# Patient Record
Sex: Female | Born: 1995 | Hispanic: No | Marital: Single | Smoking: Never smoker
Health system: Southern US, Community
[De-identification: ages and names within clinical notes are randomized; demographics above are authoritative.]

## PROBLEM LIST (undated history)

## (undated) DIAGNOSIS — D571 Sickle-cell disease without crisis: Secondary | ICD-10-CM

## (undated) DIAGNOSIS — D649 Anemia, unspecified: Secondary | ICD-10-CM

## (undated) HISTORY — DX: Anemia, unspecified: D64.9

## (undated) HISTORY — DX: Sickle-cell disease without crisis: D57.1

---

## 2014-03-06 ENCOUNTER — Ambulatory Visit (INDEPENDENT_AMBULATORY_CARE_PROVIDER_SITE_OTHER): Payer: Managed Care, Other (non HMO) | Admitting: Physician Assistant

## 2014-03-06 VITALS — BP 118/82 | HR 121 | Temp 98.7°F | Resp 16 | Ht 61.0 in | Wt 115.0 lb

## 2014-03-06 DIAGNOSIS — N946 Dysmenorrhea, unspecified: Secondary | ICD-10-CM

## 2014-03-06 MED ORDER — IBUPROFEN 800 MG PO TABS: 800.0000 mg | ORAL_TABLET | Freq: Three times a day (TID) | ORAL | Status: DC | PRN

## 2014-03-06 NOTE — Patient Instructions (Signed)
Take the ibuprofen 800mg  every 8 hours with food - this will help your cramps and back pain  Drink plenty of water  If you periods continue to be very painful, we can start you on birth control that will help improve this - read through the information I gave you.  If you decide to start this please let me know and I will send medicine to your pharmacy

## 2014-03-06 NOTE — Progress Notes (Signed)
   Subjective:    Patient ID: Adrienne Hawkins, female    DOB: 1996/07/24, 18 y.o.   MRN: [id number]  HPI   Adrienne Hawkins is a pleasant 18 yr old female here with concern for dysmenorrhea.  She states she is having bad menstrual cramps.  This started 2 hours ago.  She is very upset and crying in the exam room.  She always has very painful periods.  She has taken nothing for symptoms.  She wants an injection of pain medicine.  She reports that she is bleeding the usual amount.  States her whole abdomen is sore as is her lower back.  She denies NVD.  No fever.  She has never been sexually active - no chance of pregnancy or STI.  Last period 4 wks ago - unsure what day.  Unsure if pt is having regular periods.  She is not using contraception.  There is a language barrier.   Review of Systems  Constitutional: Negative for fever and chills.  Respiratory: Negative.   Cardiovascular: Negative.   Gastrointestinal: Positive for abdominal pain. Negative for nausea, vomiting and diarrhea.  Genitourinary: Positive for vaginal bleeding and pelvic pain. Negative for dysuria, urgency and frequency.  Musculoskeletal: Positive for back pain.  Skin: Negative.        Objective:   Physical Exam  Vitals reviewed. Constitutional: She is oriented to person, place, and time. She appears well-developed and well-nourished. No distress.  HENT:  Head: Normocephalic and atraumatic.  Eyes: Conjunctivae are normal. No scleral icterus.  Cardiovascular: Normal rate, regular rhythm and normal heart sounds.   Pulmonary/Chest: Effort normal and breath sounds normal. She has no wheezes. She has no rales.  Abdominal: Soft. There is tenderness (diffuse but ost prominent in suprapubic area).  Neurological: She is alert and oriented to person, place, and time.  Skin: Skin is warm and dry.  Psychiatric: She has a normal mood and affect. Her behavior is normal.       Assessment & Plan:  Dysmenorrhea - Plan: ibuprofen  (ADVIL,MOTRIN) 800 MG tablet   Adrienne Hawkins is a pleasant 18 yr old female with dysmenorrhea.  Exam reveals diffuse abdominal tenderness but there is no rebound/rigidity.  Afebrile, VSS.  She has never been sexually active, so no chance of pregnancy or STI.  She reports she is bleeding the normal amount.  LMP 4 wks ago.  Will start ibuprofen 800mg  TID with food prn cramping.  Discussed with pt that starting a contraceptive method will likely improve her symptoms.  I have provided her with information to read over about contraceptive options.  She will call if she would like to start medication.  Pt to call or RTC if worsening or not improving  E. Frances FurbishElizabeth Takyah Hawkins MHS, PA-C [facility] 3/22/20159:42 AM

## 2014-04-14 ENCOUNTER — Emergency Department (HOSPITAL_COMMUNITY)
Admission: EM | Admit: 2014-04-14 | Discharge: 2014-04-14 | Disposition: A | Discharge status: Home / Self Care | Payer: Managed Care, Other (non HMO) | Attending: Emergency Medicine | Admitting: Emergency Medicine

## 2014-04-14 ENCOUNTER — Encounter (HOSPITAL_COMMUNITY): Payer: Self-pay | Admitting: Emergency Medicine

## 2014-04-14 DIAGNOSIS — F411 Generalized anxiety disorder: Secondary | ICD-10-CM | POA: Insufficient documentation

## 2014-04-14 DIAGNOSIS — K2990 Gastroduodenitis, unspecified, without bleeding: Secondary | ICD-10-CM

## 2014-04-14 DIAGNOSIS — D649 Anemia, unspecified: Secondary | ICD-10-CM | POA: Insufficient documentation

## 2014-04-14 DIAGNOSIS — R0682 Tachypnea, not elsewhere classified: Secondary | ICD-10-CM | POA: Insufficient documentation

## 2014-04-14 DIAGNOSIS — K2960 Other gastritis without bleeding: Secondary | ICD-10-CM

## 2014-04-14 DIAGNOSIS — R51 Headache: Secondary | ICD-10-CM | POA: Insufficient documentation

## 2014-04-14 DIAGNOSIS — R519 Headache, unspecified: Secondary | ICD-10-CM

## 2014-04-14 DIAGNOSIS — H53149 Visual discomfort, unspecified: Secondary | ICD-10-CM | POA: Insufficient documentation

## 2014-04-14 DIAGNOSIS — K2970 Gastritis, unspecified, without bleeding: Secondary | ICD-10-CM | POA: Insufficient documentation

## 2014-04-14 DIAGNOSIS — T394X5A Adverse effect of antirheumatics, not elsewhere classified, initial encounter: Secondary | ICD-10-CM | POA: Insufficient documentation

## 2014-04-14 DIAGNOSIS — Z79899 Other long term (current) drug therapy: Secondary | ICD-10-CM | POA: Insufficient documentation

## 2014-04-14 DIAGNOSIS — T39395A Adverse effect of other nonsteroidal anti-inflammatory drugs [NSAID], initial encounter: Secondary | ICD-10-CM

## 2014-04-14 DIAGNOSIS — J3489 Other specified disorders of nose and nasal sinuses: Secondary | ICD-10-CM | POA: Insufficient documentation

## 2014-04-14 LAB — CBC WITH DIFFERENTIAL/PLATELET
Basophils Absolute: 0 10*3/uL (ref 0.0–0.1)
Basophils Relative: 0 % (ref 0–1)
EOS ABS: 0.1 10*3/uL (ref 0.0–0.7)
Eosinophils Relative: 1 % (ref 0–5)
HCT: 31.3 % — ABNORMAL LOW (ref 36.0–46.0)
HEMOGLOBIN: 10 g/dL — AB (ref 12.0–15.0)
Lymphocytes Relative: 49 % — ABNORMAL HIGH (ref 12–46)
Lymphs Abs: 2.7 10*3/uL (ref 0.7–4.0)
MCH: 19.2 pg — AB (ref 26.0–34.0)
MCHC: 31.9 g/dL (ref 30.0–36.0)
MCV: 60 fL — AB (ref 78.0–100.0)
Monocytes Absolute: 0.5 10*3/uL (ref 0.1–1.0)
Monocytes Relative: 9 % (ref 3–12)
Neutro Abs: 2.3 10*3/uL (ref 1.7–7.7)
Neutrophils Relative %: 41 % — ABNORMAL LOW (ref 43–77)
Platelets: 337 10*3/uL (ref 150–400)
RBC: 5.22 MIL/uL — ABNORMAL HIGH (ref 3.87–5.11)
RDW: 15.3 % (ref 11.5–15.5)
WBC: 5.6 10*3/uL (ref 4.0–10.5)

## 2014-04-14 LAB — COMPREHENSIVE METABOLIC PANEL
ALK PHOS: 42 U/L (ref 39–117)
ALT: 6 U/L (ref 0–35)
AST: 12 U/L (ref 0–37)
Albumin: 4.5 g/dL (ref 3.5–5.2)
BUN: 9 mg/dL (ref 6–23)
CO2: 21 mEq/L (ref 19–32)
Calcium: 9.6 mg/dL (ref 8.4–10.5)
Chloride: 102 mEq/L (ref 96–112)
Creatinine, Ser: 0.63 mg/dL (ref 0.50–1.10)
Glucose, Bld: 99 mg/dL (ref 70–99)
Potassium: 3.7 mEq/L (ref 3.7–5.3)
Sodium: 138 mEq/L (ref 137–147)
TOTAL PROTEIN: 7.5 g/dL (ref 6.0–8.3)
Total Bilirubin: 0.7 mg/dL (ref 0.3–1.2)

## 2014-04-14 LAB — LIPASE, BLOOD: LIPASE: 28 U/L (ref 11–59)

## 2014-04-14 LAB — HCG, SERUM, QUALITATIVE: Preg, Serum: NEGATIVE

## 2014-04-14 MED ORDER — ESOMEPRAZOLE MAGNESIUM 20 MG PO CPDR: 20.0000 mg | DELAYED_RELEASE_CAPSULE | Freq: Every day | ORAL | Status: DC

## 2014-04-14 MED ORDER — ONDANSETRON 4 MG PO TBDP
4.0000 mg | ORAL_TABLET | Freq: Once | ORAL | Status: AC
  Administered 2014-04-14: 4 mg via ORAL
  Filled 2014-04-14: qty 1

## 2014-04-14 MED ORDER — TRAMADOL HCL 50 MG PO TABS: 50.0000 mg | ORAL_TABLET | Freq: Four times a day (QID) | ORAL | Status: DC | PRN

## 2014-04-14 MED ORDER — GI COCKTAIL ~~LOC~~
30.0000 mL | Freq: Once | ORAL | Status: AC
  Administered 2014-04-14: 30 mL via ORAL
  Filled 2014-04-14: qty 30

## 2014-04-14 MED ORDER — PANTOPRAZOLE SODIUM 40 MG PO TBEC
40.0000 mg | DELAYED_RELEASE_TABLET | Freq: Once | ORAL | Status: AC
  Administered 2014-04-14: 40 mg via ORAL
  Filled 2014-04-14: qty 1

## 2014-04-14 MED ORDER — ONDANSETRON 4 MG PO TBDP: ORAL_TABLET | ORAL | Status: DC

## 2014-04-14 NOTE — ED Notes (Signed)
Pt reports having headache that started at 8pm. Family reports pt saying she took ibuprofen but pt had no relief. Pt denies nausea or vomiting but reports sensitivity to light. Pt alert in triage area.

## 2014-04-14 NOTE — ED Provider Notes (Signed)
CSN: [id number]     Arrival date & time 04/14/14  0056 History   First MD Initiated Contact with Patient 04/14/14 0131     Chief Complaint  Patient presents with  . Headache     (Consider location/radiation/quality/duration/timing/severity/associated sxs/prior Treatment) HPI Patient presents for abdominal pain and nausea after taking multiple doses of ibuprofen for a frontal headache that started at 8 PM this evening. The patient's headache is dramatically improved. The headache is worse with bending forward. She has mild sensitivity to light. She denies any neck pain or stiffness. Has no focal weakness or numbness. She's been under increased stress lately and believes this is contributing to her headache. Patient admits to taking 7 tablets of ibuprofen between the hours of 8 PM and midnight. This is an attempt to relieve pain and not suicide attempt. Past Medical History  Diagnosis Date  . Sickle cell anemia     doesnt really know   History reviewed. No pertinent past surgical history. No family history on file. History  Substance Use Topics  . Smoking status: Never Smoker   . Smokeless tobacco: Not on file  . Alcohol Use: No   OB History   Grav Para Term Preterm Abortions TAB SAB Ect Mult Living                 Review of Systems  Constitutional: Negative for fever and chills.  HENT: Positive for congestion and sinus pressure.   Eyes: Positive for photophobia.  Respiratory: Negative for cough.   Cardiovascular: Negative for chest pain.  Gastrointestinal: Positive for nausea and abdominal pain. Negative for vomiting.  Musculoskeletal: Negative for back pain, myalgias, neck pain and neck stiffness.  Skin: Negative for rash and wound.  Neurological: Positive for headaches. Negative for dizziness, weakness, light-headedness and numbness.  All other systems reviewed and are negative.     Allergies  Review of patient's allergies indicates no known allergies.  Home  Medications   Prior to Admission medications   Medication Sig Start Date End Date Taking? Authorizing Provider  folic acid (FOLVITE) 1 MG tablet Take 1 mg by mouth daily.   Yes Historical Provider, MD  ibuprofen (ADVIL,MOTRIN) 800 MG tablet Take 1 tablet (800 mg total) by mouth every 8 (eight) hours as needed. 03/06/14  Yes Eleanore E Egan, PA-C   BP 128/75  Pulse 80  Temp(Src) 98 F (36.7 C) (Oral)  Resp 18  SpO2 100% Physical Exam  Nursing note and vitals reviewed. Constitutional: She is oriented to person, place, and time. She appears well-developed and well-nourished. No distress.  Anxious appearing  HENT:  Head: Normocephalic and atraumatic.  Mouth/Throat: Oropharynx is clear and moist.  Mild tenderness to percussion over the frontal sinuses. She has bilateral nasal mucosal edema.  Eyes: EOM are normal. Pupils are equal, round, and reactive to light.  Neck: Normal range of motion. Neck supple.  No meningismus  Cardiovascular: Normal rate and regular rhythm.   Pulmonary/Chest: Breath sounds normal. No respiratory distress. She has no wheezes. She has no rales.  Mild tachypnea  Abdominal: Soft. Bowel sounds are normal. She exhibits no distension and no mass. There is tenderness (to palpation in the epigastric and left upper quadrant.). There is no rebound and no guarding.  Musculoskeletal: Normal range of motion. She exhibits no edema and no tenderness.  Neurological: She is alert and oriented to person, place, and time.  Patient is alert and oriented x3 with clear, goal oriented speech. Patient has 5/5 motor in  all extremities. Sensation is intact to light touch.   Skin: Skin is warm and dry. No rash noted. No erythema.  Psychiatric:  Anxious    ED Course  Procedures (including critical care time) Labs Review Labs Reviewed  CBC WITH DIFFERENTIAL  COMPREHENSIVE METABOLIC PANEL  LIPASE, BLOOD    Imaging Review No results found.   EKG Interpretation None      MDM    Final diagnoses:  None    Suspect NSAID-induced gastritis. Headache may be tension/migraine/sinus related. It is improved now. She has had similar headaches in the past. It was gradual onset. She has a normal neurologic exam. There is no concerning features for this headache requiring imaging.   Patient is feeling much better now. She's had no further nausea in the emergency department. Her abdomen is soft. She's been advised to take medication only as it is prescribed. Return precautions have been given and patient voiced understanding.  Loren Raceravid Lashaundra Lehrmann, MD 04/14/14 [id number]

## 2014-04-14 NOTE — Discharge Instructions (Signed)
Stop taking ibuprofen. You may take Tylenol for headaches. Take prescribed medication and followup with gastroenterology should her symptoms continue. Return immediately for blood in your stool, vomiting blood, worsening pain, fever or for any concerns.  Gastritis, Adult Gastritis is soreness and swelling (inflammation) of the lining of the stomach. Gastritis can develop as a sudden onset (acute) or long-term (chronic) condition. If gastritis is not treated, it can lead to stomach bleeding and ulcers. CAUSES  Gastritis occurs when the stomach lining is weak or damaged. Digestive juices from the stomach then inflame the weakened stomach lining. The stomach lining may be weak or damaged due to viral or bacterial infections. One common bacterial infection is the Helicobacter pylori infection. Gastritis can also result from excessive alcohol consumption, taking certain medicines, or having too much acid in the stomach.  SYMPTOMS  In some cases, there are no symptoms. When symptoms are present, they may include:  Pain or a burning sensation in the upper abdomen.  Nausea.  Vomiting.  An uncomfortable feeling of fullness after eating. DIAGNOSIS  Your caregiver may suspect you have gastritis based on your symptoms and a physical exam. To determine the cause of your gastritis, your caregiver may perform the following:  Blood or stool tests to check for the H pylori bacterium.  Gastroscopy. A thin, flexible tube (endoscope) is passed down the esophagus and into the stomach. The endoscope has a light and camera on the end. Your caregiver uses the endoscope to view the inside of the stomach.  Taking a tissue sample (biopsy) from the stomach to examine under a microscope. TREATMENT  Depending on the cause of your gastritis, medicines may be prescribed. If you have a bacterial infection, such as an H pylori infection, antibiotics may be given. If your gastritis is caused by too much acid in the stomach,  H2 blockers or antacids may be given. Your caregiver may recommend that you stop taking aspirin, ibuprofen, or other nonsteroidal anti-inflammatory drugs (NSAIDs). HOME CARE INSTRUCTIONS  Only take over-the-counter or prescription medicines as directed by your caregiver.  If you were given antibiotic medicines, take them as directed. Finish them even if you start to feel better.  Drink enough fluids to keep your urine clear or pale yellow.  Avoid foods and drinks that make your symptoms worse, such as:  Caffeine or alcoholic drinks.  Chocolate.  Peppermint or mint flavorings.  Garlic and onions.  Spicy foods.  Citrus fruits, such as oranges, lemons, or limes.  Tomato-based foods such as sauce, chili, salsa, and pizza.  Fried and fatty foods.  Eat small, frequent meals instead of large meals. SEEK IMMEDIATE MEDICAL CARE IF:   You have black or dark red stools.  You vomit blood or material that looks like coffee grounds.  You are unable to keep fluids down.  Your abdominal pain gets worse.  You have a fever.  You do not feel better after 1 week.  You have any other questions or concerns. MAKE SURE YOU:  Understand these instructions.  Will watch your condition.  Will get help right away if you are not doing well or get worse. Document Released: 11/27/2001 Document Revised: 06/03/2012 Document Reviewed: 01/16/2012 [facility] Patient Information 2014 [employer] [place], [place].

## 2014-05-10 ENCOUNTER — Emergency Department (HOSPITAL_COMMUNITY)
Admission: EM | Admit: 2014-05-10 | Discharge: 2014-05-10 | Disposition: A | Discharge status: Home / Self Care | Payer: Managed Care, Other (non HMO) | Attending: Emergency Medicine | Admitting: Emergency Medicine

## 2014-05-10 ENCOUNTER — Encounter (HOSPITAL_COMMUNITY): Payer: Self-pay | Admitting: Emergency Medicine

## 2014-05-10 DIAGNOSIS — S1093XA Contusion of unspecified part of neck, initial encounter: Secondary | ICD-10-CM

## 2014-05-10 DIAGNOSIS — S060X9A Concussion with loss of consciousness of unspecified duration, initial encounter: Secondary | ICD-10-CM

## 2014-05-10 DIAGNOSIS — F430 Acute stress reaction: Secondary | ICD-10-CM | POA: Insufficient documentation

## 2014-05-10 DIAGNOSIS — R51 Headache: Secondary | ICD-10-CM

## 2014-05-10 DIAGNOSIS — R42 Dizziness and giddiness: Secondary | ICD-10-CM | POA: Insufficient documentation

## 2014-05-10 DIAGNOSIS — Y9389 Activity, other specified: Secondary | ICD-10-CM | POA: Insufficient documentation

## 2014-05-10 DIAGNOSIS — Z79899 Other long term (current) drug therapy: Secondary | ICD-10-CM | POA: Insufficient documentation

## 2014-05-10 DIAGNOSIS — S0003XA Contusion of scalp, initial encounter: Secondary | ICD-10-CM | POA: Insufficient documentation

## 2014-05-10 DIAGNOSIS — Y92009 Unspecified place in unspecified non-institutional (private) residence as the place of occurrence of the external cause: Secondary | ICD-10-CM | POA: Insufficient documentation

## 2014-05-10 DIAGNOSIS — W1809XA Striking against other object with subsequent fall, initial encounter: Secondary | ICD-10-CM | POA: Insufficient documentation

## 2014-05-10 DIAGNOSIS — S060X1A Concussion with loss of consciousness of 30 minutes or less, initial encounter: Secondary | ICD-10-CM

## 2014-05-10 DIAGNOSIS — D571 Sickle-cell disease without crisis: Secondary | ICD-10-CM | POA: Insufficient documentation

## 2014-05-10 DIAGNOSIS — R519 Headache, unspecified: Secondary | ICD-10-CM

## 2014-05-10 DIAGNOSIS — W010XXA Fall on same level from slipping, tripping and stumbling without subsequent striking against object, initial encounter: Secondary | ICD-10-CM | POA: Insufficient documentation

## 2014-05-10 DIAGNOSIS — S0083XA Contusion of other part of head, initial encounter: Secondary | ICD-10-CM | POA: Insufficient documentation

## 2014-05-10 DIAGNOSIS — Z3202 Encounter for pregnancy test, result negative: Secondary | ICD-10-CM | POA: Insufficient documentation

## 2014-05-10 LAB — I-STAT CHEM 8, ED
BUN: 16 mg/dL (ref 6–23)
CHLORIDE: 104 meq/L (ref 96–112)
CREATININE: 0.7 mg/dL (ref 0.50–1.10)
Calcium, Ion: 1.25 mmol/L — ABNORMAL HIGH (ref 1.12–1.23)
Glucose, Bld: 119 mg/dL — ABNORMAL HIGH (ref 70–99)
HCT: 35 % — ABNORMAL LOW (ref 36.0–46.0)
HEMOGLOBIN: 11.9 g/dL — AB (ref 12.0–15.0)
POTASSIUM: 3.7 meq/L (ref 3.7–5.3)
SODIUM: 141 meq/L (ref 137–147)
TCO2: 24 mmol/L (ref 0–100)

## 2014-05-10 LAB — POC URINE PREG, ED: Preg Test, Ur: NEGATIVE

## 2014-05-10 MED ORDER — IBUPROFEN 800 MG PO TABS
800.0000 mg | ORAL_TABLET | Freq: Once | ORAL | Status: AC
  Administered 2014-05-10: 800 mg via ORAL
  Filled 2014-05-10: qty 1

## 2014-05-10 MED ORDER — SODIUM CHLORIDE 0.9 % IV BOLUS (SEPSIS)
1000.0000 mL | Freq: Once | INTRAVENOUS | Status: AC
Start: 2014-05-10 — End: 2014-05-10
  Administered 2014-05-10: 1000 mL via INTRAVENOUS

## 2014-05-10 MED ORDER — IBUPROFEN 800 MG PO TABS: 800.0000 mg | ORAL_TABLET | Freq: Three times a day (TID) | ORAL | Status: DC

## 2014-05-10 NOTE — ED Provider Notes (Signed)
CSN: [id number]     Arrival date & time 05/10/14  0142 History   First MD Initiated Contact with Patient 05/10/14 0210     Chief Complaint  Patient presents with  . Headache  . Loss of Consciousness   HPI  History provided by patient. Patient is a 18 year old [place] female who presents complaints of headache as well as a head injury. Patient reports having a waxing headache for the past 2 days. Headache has seemed worse with increased stress. It does seem slightly better when she rests and close her eyes. She has not used any medications to treat her headache symptoms. There has not been any recent illnesses, fever, chills or sweats. Patient is at home and in the bathroom when she had worsened headache and started to feel slightly lightheaded and dizzy. She reports stumbling and hitting her head against this. He believes there may have been a brief LOC but is unsure. She had a small mark to the scalp with only a small amount of bleeding. This is controlled. She does report being current on her immunizations for school. She also reports poor eating for the past 3 days. No other aggravating or alleviating factors. No other associated symptoms.    Past Medical History  Diagnosis Date  . Sickle cell anemia     doesnt really know   History reviewed. No pertinent past surgical history. History reviewed. No pertinent family history. History  Substance Use Topics  . Smoking status: Never Smoker   . Smokeless tobacco: Not on file  . Alcohol Use: No   OB History   Grav Para Term Preterm Abortions TAB SAB Ect Mult Living                 Review of Systems  Constitutional: Negative for fever and chills.  Eyes: Negative for visual disturbance.  Gastrointestinal: Negative for vomiting and diarrhea.  Neurological: Positive for light-headedness and headaches. Negative for dizziness.  All other systems reviewed and are negative.     Allergies  Review of patient's allergies indicates no  known allergies.  Home Medications   Prior to Admission medications   Medication Sig Start Date End Date Taking? Authorizing Provider  acetaminophen (TYLENOL) 500 MG tablet Take 1,000 mg by mouth every 6 (six) hours as needed (pain).   Yes Historical Provider, MD  folic acid (FOLVITE) 1 MG tablet Take 1 mg by mouth daily.   Yes Historical Provider, MD  ibuprofen (ADVIL,MOTRIN) 800 MG tablet Take 1 tablet (800 mg total) by mouth every 8 (eight) hours as needed. 03/06/14  Yes Eleanore E Egan, PA-C   BP 111/80  Pulse 90  Temp(Src) 97.5 F (36.4 C) (Oral)  SpO2 96%  LMP 03/10/2014 Physical Exam  Nursing note and vitals reviewed. Constitutional: She is oriented to person, place, and time. She appears well-developed and well-nourished. No distress.  HENT:  Head: Normocephalic.  There is a small mark with slight hematoma to the right for head and frontal scalp area. No bleeding or dried blood present. No depressed skull fracture. No Battle sign or raccoon eyes.  Eyes: Conjunctivae and EOM are normal. Pupils are equal, round, and reactive to light.  Neck: Normal range of motion. Neck supple.  No meningeal signs  Cardiovascular: Normal rate and regular rhythm.   No murmur heard. Pulmonary/Chest: Effort normal and breath sounds normal. No respiratory distress. She has no wheezes. She has no rales.  Abdominal: Soft. There is no tenderness. There is no rigidity, no  rebound, no guarding, no CVA tenderness and no tenderness at McBurney's point.  Neurological: She is alert and oriented to person, place, and time. She has normal strength. No cranial nerve deficit or sensory deficit. Gait normal.  Skin: Skin is warm and dry. No rash noted.  Psychiatric: She has a normal mood and affect. Her behavior is normal.    ED Course  Procedures   COORDINATION OF CARE:  Nursing notes reviewed. Vital signs reviewed. Initial pt interview and examination performed.   Filed Vitals:   05/10/14 0152 05/10/14  0211 05/10/14 0212 05/10/14 0213  BP: 123/87 109/72 128/74 111/80  Pulse: 106 87 113 90  Temp: 97.5 F (36.4 C)     TempSrc: Oral     SpO2: 96%       2:51 AM-patient seen and evaluated. She is resting appears well no acute distress. Does not appear severely ill toxic.  Patient with a small superficial mark to the right frontal scalp area. No bleeding. No depressed skull fractures. She has normal nonfocal neuro exam. She does report questionable LOC. I discussed options for a head CT given the possible LOC. Patient refused this. I discussed the risks of a possible brain injury, bleed or other concerning causes may have explained her headaches. She does not wish to have this performed. She states she feels headaches are related to her poor eating and stress about recently immigrating for school to the [place] away from family.  Patient feeling better after IV fluids and ibuprofen. Denies any significant headache at this time. Labs without any concerning findings. Patient continues to have a normal nonfocal neuro exam. Answering her spine questions normally. Continues not wish for any further imaging or testing. She is ready to be discharged home. Strict return precautions given.   Treatment plan initiated: Medications  ibuprofen (ADVIL,MOTRIN) tablet 800 mg (800 mg Oral Given 05/10/14 0338)  sodium chloride 0.9 % bolus 1,000 mL (1,000 mLs Intravenous New Bag/Given 05/10/14 0350)    Results for orders placed during the hospital encounter of 05/10/14  POC URINE PREG, ED      Result Value Ref Range   Preg Test, Ur NEGATIVE  NEGATIVE  I-STAT CHEM 8, ED      Result Value Ref Range   Sodium 141  137 - 147 mEq/L   Potassium 3.7  3.7 - 5.3 mEq/L   Chloride 104  96 - 112 mEq/L   BUN 16  6 - 23 mg/dL   Creatinine, Ser 9.16  0.50 - 1.10 mg/dL   Glucose, Bld 606 (*) 70 - 99 mg/dL   Calcium, Ion 0.04 (*) 1.12 - 1.23 mmol/L   TCO2 24  0 - 100 mmol/L   Hemoglobin 11.9 (*) 12.0 - 15.0 g/dL   HCT 59.9 (*)  77.4 - 46.0 %          MDM   Final diagnoses:  Headache  Concussion with brief LOC       Angus Seller, PA-C 05/10/14 0503

## 2014-05-10 NOTE — ED Notes (Signed)
Bed: [facility] Expected date:  Expected time:  Means of arrival:  Comments: 

## 2014-05-10 NOTE — Discharge Instructions (Signed)
You were seen and evaluated for your continued headache and head injury. Your providers recommended evaluation of your head injury with a CAT scan however you refused. There may be a small risk for a concerning injury including bleeding around your brain or other causes for your headache symptoms. These conditions could be due to worsening symptoms or even worsened health problems and possible death. Return anytime if you wish to have further evaluation or if you have any changing or worsening symptoms.   Concussion, Adult A concussion, or closed-head injury, is a brain injury caused by a direct blow to the head or by a quick and sudden movement (jolt) of the head or neck. Concussions are usually not life-threatening. Even so, the effects of a concussion can be serious. If you have had a concussion before, you are more likely to experience concussion-like symptoms after a direct blow to the head.  CAUSES   Direct blow to the head, such as from running into another player during a soccer game, being hit in a fight, or hitting your head on a hard surface.  A jolt of the head or neck that causes the brain to move back and forth inside the skull, such as in a car crash. SIGNS AND SYMPTOMS  The signs of a concussion can be hard to notice. Early on, they may be missed by you, family members, and health care providers. You may look fine but act or feel differently. Symptoms are usually temporary, but they may last for days, weeks, or even longer. Some symptoms may appear right away while others may not show up for hours or days. Every head injury is different. Symptoms include:   Mild to moderate headaches that will not go away.  A feeling of pressure inside your head.  Having more trouble than usual:   Learning or remembering things you have heard.  Answering questions.  Paying attention or concentrating.   Organizing daily tasks.   Making decisions and solving problems.   Slowness in  thinking, acting or reacting, speaking, or reading.   Getting lost or being easily confused.   Feeling tired all the time or lacking energy (fatigued).   Feeling drowsy.   Sleep disturbances.   Sleeping more than usual.   Sleeping less than usual.   Trouble falling asleep.   Trouble sleeping (insomnia).   Loss of balance or feeling lightheaded or dizzy.   Nausea or vomiting.   Numbness or tingling.   Increased sensitivity to:   Sounds.   Lights.   Distractions.   Vision problems or eyes that tire easily.   Diminished sense of taste or smell.   Ringing in the ears.   Mood changes such as feeling sad or anxious.   Becoming easily irritated or angry for little or no reason.   Lack of motivation.  Seeing or hearing things other people do not see or hear (hallucinations). DIAGNOSIS  Your health care provider can usually diagnose a concussion based on a description of your injury and symptoms. He or she will ask whether you passed out (lost consciousness) and whether you are having trouble remembering events that happened right before and during your injury.  Your evaluation might include:   A brain scan to look for signs of injury to the brain. Even if the test shows no injury, you may still have a concussion.   Blood tests to be sure other problems are not present. TREATMENT   Concussions are usually treated in an emergency  department, in urgent care, or at a clinic. You may need to stay in the hospital overnight for further treatment.   Tell your health care provider if you are taking any medicines, including prescription medicines, over-the-counter medicines, and natural remedies. Some medicines, such as blood thinners (anticoagulants) and aspirin, may increase the chance of complications. Also tell your health care provider whether you have had alcohol or are taking illegal drugs. This information may affect treatment.  Your health care  provider will send you home with important instructions to follow.  How fast you will recover from a concussion depends on many factors. These factors include how severe your concussion is, what part of your brain was injured, your age, and how healthy you were before the concussion.  Most people with mild injuries recover fully. Recovery can take time. In general, recovery is slower in older persons. Also, persons who have had a concussion in the past or have other medical problems may find that it takes longer to recover from their current injury. HOME CARE INSTRUCTIONS  General Instructions  Carefully follow the directions your health care provider gave you.  Only take over-the-counter or prescription medicines for pain, discomfort, or fever as directed by your health care provider.  Take only those medicines that your health care provider has approved.  Do not drink alcohol until your health care provider says you are well enough to do so. Alcohol and certain other drugs may slow your recovery and can put you at risk of further injury.  If it is harder than usual to remember things, write them down.  If you are easily distracted, try to do one thing at a time. For example, do not try to watch TV while fixing dinner.  Talk with family members or close friends when making important decisions.  Keep all follow-up appointments. Repeated evaluation of your symptoms is recommended for your recovery.  Watch your symptoms and tell others to do the same. Complications sometimes occur after a concussion. Older adults with a brain injury may have a higher risk of serious complications such as of a blood clot on the brain.  Tell your teachers, school nurse, school counselor, coach, athletic trainer, or work Production designer, theatre/television/film about your injury, symptoms, and restrictions. Tell them about what you can or cannot do. They should watch for:   Increased problems with attention or concentration.   Increased  difficulty remembering or learning new information.   Increased time needed to complete tasks or assignments.   Increased irritability or decreased ability to cope with stress.   Increased symptoms.   Rest. Rest helps the brain to heal. Make sure you:  Get plenty of sleep at night. Avoid staying up late at night.  Keep the same bedtime hours on weekends and weekdays.  Rest during the day. Take daytime naps or rest breaks when you feel tired.  Limit activities that require a lot of thought or concentration. These includes   Doing homework or job-related work.   Watching TV.   Working on the computer.  Avoid any situation where there is potential for another head injury (football, hockey, soccer, basketball, martial arts, downhill snow sports and horseback riding). Your condition will get worse every time you experience a concussion. You should avoid these activities until you are evaluated by the appropriate follow-up caregivers. Returning To Your Regular Activities You will need to return to your normal activities slowly, not all at once. You must give your body and brain enough time for  recovery.  Do not return to sports or other athletic activities until your health care provider tells you it is safe to do so.  Ask your health care provider when you can drive, ride a bicycle, or operate heavy machinery. Your ability to react may be slower after a brain injury. Never do these activities if you are dizzy.  Ask your health care provider about when you can return to work or school. Preventing Another Concussion It is very important to avoid another brain injury, especially before you have recovered. In rare cases, another injury can lead to permanent brain damage, brain swelling, or death. The risk of this is greatest during the first 7 10 days after a head injury. Avoid injuries by:   Wearing a seat belt when riding in a car.   Drinking alcohol only in moderation.    Wearing a helmet when biking, skiing, skateboarding, skating, or doing similar activities.  Avoiding activities that could lead to a second concussion, such as contact or recreational sports, until your health care provider says it is OK.  Taking safety measures in your home.   Remove clutter and tripping hazards from floors and stairways.   Use grab bars in bathrooms and handrails by stairs.   Place non-slip mats on floors and in bathtubs.   Improve lighting in dim areas. SEEK MEDICAL CARE IF:   You have increased problems paying attention or concentrating.   You have increased difficulty remembering or learning new information.   You need more time to complete tasks or assignments than before.   You have increased irritability or decreased ability to cope with stress.  You have more symptoms than before. Seek medical care if you have any of the following symptoms for more than 2 weeks after your injury:   Lasting (chronic) headaches.   Dizziness or balance problems.   Nausea.  Vision problems.   Increased sensitivity to noise or light.   Depression or mood swings.   Anxiety or irritability.   Memory problems.   Difficulty concentrating or paying attention.   Sleep problems.   Feeling tired all the time. SEEK IMMEDIATE MEDICAL CARE IF:   You have severe or worsening headaches. These may be a sign of a blood clot in the brain.  You have weakness (even if only in one hand, leg, or part of the face).  You have numbness.  You have decreased coordination.   You vomit repeatedly.  You have increased sleepiness.  One pupil is larger than the other.   You have convulsions.   You have slurred speech.   You have increased confusion. This may be a sign of a blood clot in the brain.  You have increased restlessness, agitation, or irritability.   You are unable to recognize people or places.   You have neck pain.   It is  difficult to wake you up.   You have unusual behavior changes.   You lose consciousness. MAKE SURE YOU:   Understand these instructions.  Will watch your condition.  Will get help right away if you are not doing well or get worse. Document Released: 02/23/2004 Document Revised: 08/05/2013 Document Reviewed: 06/25/2013 [facility] Patient Information 2014 [place], [place].

## 2014-05-10 NOTE — ED Notes (Signed)
Pt states that she has had a headache x 2 days and today she was dizzy and had blurred vision from the headache causing her to fall down and hit her head on the vanity in the bathroom. States that she did lose consciousness. Alert and oriented at this time.

## 2014-05-10 NOTE — ED Notes (Signed)
Entry for 02:15 and 02:16 were entered in error

## 2014-05-10 NOTE — ED Provider Notes (Signed)
Medical screening examination/treatment/procedure(s) were performed by non-physician practitioner and as supervising physician I was immediately available for consultation/collaboration.   EKG Interpretation None        Enid Skeens, MD 05/10/14 [phone]

## 2014-05-13 ENCOUNTER — Ambulatory Visit (INDEPENDENT_AMBULATORY_CARE_PROVIDER_SITE_OTHER): Payer: Managed Care, Other (non HMO) | Admitting: Physician Assistant

## 2014-05-13 VITALS — BP 108/70 | HR 87 | Temp 99.0°F | Resp 16 | Ht 62.0 in | Wt 113.6 lb

## 2014-05-13 DIAGNOSIS — Z111 Encounter for screening for respiratory tuberculosis: Secondary | ICD-10-CM

## 2014-05-13 NOTE — Patient Instructions (Signed)
Please have your dad translate the vaccinations.  Please drop off a copy for me Adrienne Hawkins) and I will have the orders in the computer if you need vaccinations when you return to get your TB test read.

## 2014-05-13 NOTE — Progress Notes (Signed)
   Subjective:    Patient ID: Adrienne Hawkins, female    DOB: 08-25-1996, 18 y.o.   MRN: [id number]  HPI    Review of Systems     Objective:   Physical Exam        Assessment & Plan:   Tuberculosis Risk Questionnaire  1. Yes [place] Were you born outside the [place] in one of the following parts of the world: [place], [place], [place], [place] or [place]?    2. Yes [place] Have you traveled outside the [place] and lived for more than one month in one of the following parts of the world: [place], [place], [place], [place] or [place]?    3. No Do you have a compromised immune system such as from any of the following conditions:HIV/AIDS, organ or bone marrow transplantation, diabetes, immunosuppressive medicines (e.g. Prednisone, Remicaide), leukemia, lymphoma, cancer of the head or neck, gastrectomy or jejunal bypass, end-stage renal disease (on dialysis), or silicosis?     4. No Have you ever or do you plan on working in: a residential care center, a health care facility, a jail or prison or homeless shelter?    5. No Have you ever: injected illegal drugs, used crack cocaine, lived in a homeless shelter  or been in jail or prison?     6. No Have you ever been exposed to anyone with infectious tuberculosis?    Tuberculosis Symptom Questionnaire  Do you currently have any of the following symptoms?  1. No Unexplained cough lasting more than 3 weeks?   2. No Unexplained fever lasting more than 3 weeks.   3. No Night Sweats (sweating that leaves the bedclothes and sheets wet)     4. No Shortness of Breath   5. No Chest Pain   6. No Unintentional weight loss    7. No Unexplained fatigue (very tired for no reason)

## 2014-05-13 NOTE — Progress Notes (Signed)
   Subjective:    Patient ID: Adrienne Hawkins, female    DOB: [birth year], 18 y.o.   MRN: [id number]  HPI  Pt presents to clinic for a TB test - she is from [place] and has in the [place] for 3 months - she is a Consulting civil engineer at [employer] for [place] and she needs her vaccinations.  She has her vaccine record from home but it is in Arabic.  She plans to have her father translate the vaccinations.  Review of Systems     Objective:   Physical Exam  Vitals reviewed. Constitutional: She appears well-developed and well-nourished.  HENT:  Head: Normocephalic and atraumatic.  Right Ear: External ear normal.  Left Ear: External ear normal.  Pulmonary/Chest: Effort normal.  Skin: Skin is warm and dry.  Psychiatric: She has a normal mood and affect. Her behavior is normal. Judgment and thought content normal.       Assessment & Plan:  Screening-pulmonary TB - Plan: TB Skin Test - placed the PPD tonight.  She will get me a copy of her translated vaccines and I will put in an order for her to receive them when she returns to get her PPD read.  She will need to have 3 tetanus vaccines with the last being a TDAP within 10 years and 2 MMRs.  Benny Lennert PA-C  [facility] 05/13/2014 7:53 PM

## 2014-05-15 ENCOUNTER — Ambulatory Visit (INDEPENDENT_AMBULATORY_CARE_PROVIDER_SITE_OTHER): Payer: Managed Care, Other (non HMO) | Admitting: Physician Assistant

## 2014-05-15 ENCOUNTER — Encounter: Payer: Self-pay | Admitting: Physician Assistant

## 2014-05-15 DIAGNOSIS — Z23 Encounter for immunization: Secondary | ICD-10-CM

## 2014-05-15 LAB — TB SKIN TEST: TB Skin Test: NEGATIVE

## 2014-05-15 NOTE — Progress Notes (Signed)
Subjective:    Patient ID: Adrienne Hawkins, female    DOB: Jan 31, 1996, 18 y.o.   MRN: [id number]   PCP: No PCP Per Patient  Chief Complaint  Patient presents with  . Immunizations    HPI  This patient presents for PPD reading and for immunizations.  She was seen by Ms. Weber for TB screening and immunization review on 05/13/2014 as she is starting graduate school at [employer].  Unfortunately, her vaccine record was in Arabic.  Today she brought a translation, and the vaccines she's received are entered into the electronic record.   She is lacking a Tdap within the past 10 years, and she may need a second MMR.  We elect to give it to make sure she's adequately immunized.   Review of Systems     Objective:   Physical Exam  PPD is 5 mm. This is NEGATIVE.      Assessment & Plan:  1. Need for prophylactic vaccination with measles-mumps-rubella (MMR) vaccine - MMR vaccine subcutaneous  2. Need for prophylactic vaccination with combined diphtheria-tetanus-pertussis (DTP) vaccine - Tdap vaccine greater than or equal to 7yo IM   Fara Chute, PA-C Physician Assistant-Certified [facility]

## 2014-06-29 ENCOUNTER — Ambulatory Visit (INDEPENDENT_AMBULATORY_CARE_PROVIDER_SITE_OTHER): Payer: Managed Care, Other (non HMO) | Admitting: Internal Medicine

## 2014-06-29 VITALS — BP 108/72 | HR 82 | Temp 98.2°F | Resp 16 | Ht 62.0 in | Wt 114.6 lb

## 2014-06-29 DIAGNOSIS — R110 Nausea: Secondary | ICD-10-CM

## 2014-06-29 DIAGNOSIS — N946 Dysmenorrhea, unspecified: Secondary | ICD-10-CM

## 2014-06-29 DIAGNOSIS — R1084 Generalized abdominal pain: Secondary | ICD-10-CM

## 2014-06-29 DIAGNOSIS — F4323 Adjustment disorder with mixed anxiety and depressed mood: Secondary | ICD-10-CM

## 2014-06-29 MED ORDER — MELOXICAM 15 MG PO TABS: 15.0000 mg | ORAL_TABLET | Freq: Every day | ORAL | Status: DC

## 2014-06-29 MED ORDER — ONDANSETRON 4 MG PO TBDP
4.0000 mg | ORAL_TABLET | Freq: Once | ORAL | Status: AC
  Administered 2014-06-29: 4 mg via ORAL

## 2014-06-29 MED ORDER — KETOROLAC TROMETHAMINE 30 MG/ML IJ SOLN
30.0000 mg | Freq: Once | INTRAMUSCULAR | Status: AC
  Administered 2014-06-29: 30 mg via INTRAMUSCULAR

## 2014-06-29 MED ORDER — ONDANSETRON 4 MG PO TBDP: 4.0000 mg | ORAL_TABLET | Freq: Three times a day (TID) | ORAL | Status: DC | PRN

## 2014-06-29 NOTE — Progress Notes (Addendum)
This chart was scribed for Ellamae Siaobert Yashika Mask, MD by Ardelia Memsylan Malpass, Scribe. This patient was seen in room 3 and the patient's care was started at 12:13 PM.  Subjective:    Patient ID: Adrienne Hawkins, female    DOB: 12-14-1996, 18 y.o.   MRN: [id number]  HPI  HPI Comments: Adrienne Hawkins is a 18 y.o. female with a history of sickle cell anemia who [facility] complaining of intermittent, moderate abdominal pain that radiates to her back onset yesterday. She reports associated nausea. She hurts all over. She states that she has not been able to eat or drink today due to the nausea. She states that these are typical symptoms that she has when she has a menstrual period. She states that she has periods once every 2 months. She states that she has similar symptoms for about 6 days every time she has a period. She has been evaluated several times in her home country where she is given some type of Profen by injection or some medicine intravenously along with fluids when her symptoms are very bad. She states that her father did not favor for her to take birth control for dysmenorrhea control as suggested by the doctors in [place]. At the current time she has never really spoken to any physician but has a lot of her father to tell her story and has taken directions from the doctors through her father is conversation. She feels very unsettled having to make her decisions here about her current problems without her father to help her.  She denies any suspicious food intakes. She denies any associated diarrhea, emesis, fever, dysuria or any other pain or symptoms. She states that she takes Folic acid some days due to her history of anemia. She states that when her legs feel cold, she takes Folic acid. Has never been sexually active.  She states that she is from [place], and she has been here for 4 months studying at [employer]. She states that her only family member in the [place] is  her brother, and that she is not allowed to socialize with him and his friends because they are female. She states that she does not have any friends of her own and she does not like being in the [place]. She states that she misses her family and that she will be able to go back to [place] in 9 months.  She is very reluctant to approach any type of emancipation from her parents. She contacts takes them several times a day by Skype. She has not interacted with the international students Center at [employer] or the [place] club. 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 She would like to accomplish her time here but is very anxious and depressed with her current situation.  Review of Systems  Constitutional: Negative for fever.  Gastrointestinal: Positive for nausea and abdominal pain. Negative for diarrhea.  Genitourinary: Negative for dysuria.  Musculoskeletal: Positive for back pain.       Objective:   Physical Exam  Nursing note and vitals reviewed. Constitutional: She is oriented to person, place, and time. She appears well-developed and well-nourished.  Obviously a little afraid. She is in in tears during several points of our lengthy conversation. She is apprehensive during the physical examination  HENT:  Head: Normocephalic and atraumatic.  Nose: Nose normal.  Mouth/Throat: Oropharynx is  clear and moist.  Eyes: Conjunctivae and EOM are normal. Pupils are equal, round, and reactive to light.  Neck: Normal range of motion. Neck supple. No thyromegaly present.  Cardiovascular: Normal rate, regular rhythm, normal heart sounds and intact distal pulses.   No murmur heard. Pulmonary/Chest: Effort normal and breath sounds normal. No respiratory distress. She has no wheezes.  Abdominal: Soft.  Bowel sounds active. She is tender over her entire abdomen, her entire upper and lower back, her shoulders, her  legs and feet, in fact everywhere I push. She guards over her entire abdomen although there is no rigidity or distention.   Musculoskeletal: Normal range of motion. She exhibits no edema.  Lymphadenopathy:    She has no cervical adenopathy.  Neurological: She is alert and oriented to person, place, and time.  Skin: Skin is warm and dry. No rash noted.  Psychiatric: Thought content normal.  She displays sadness, homesickness, anxiety, depressed mood at times, tearfulness.      BP 108/72  Pulse 82  Temp(Src) 98.2 F (36.8 C) (Oral)  Resp 16  Ht 5\' 2"  (1.575 m)  Wt 114 lb 9.6 oz (51.982 kg)  BMI 20.96 kg/m2  SpO2 99%  LMP 06/28/2014 Given Toradol 30 mg IM and 4 mg of Zofran  55 min ov--28min of direct counseling after medications given during which time she exhibited no particular pain  Assessment & Plan:  Nausea alone - Generalized abdominal pain -  Dysmenorrhea  Adjustment Reaction to cultural change and separation from parents, with anxious, depressed mood  Referred to the international students [employer]  website for [place] students [employer]  [employer]  [employer] foreign student resources   Meds ordered this encounter  Medications  . ketorolac (TORADOL) 30 MG/ML injection 30 mg    Sig:   . ondansetron (ZOFRAN-ODT) disintegrating tablet 4 mg    Sig:   . ondansetron (ZOFRAN ODT) 4 MG disintegrating tablet    Sig: Take 1 tablet (4 mg total) by mouth every 8 (eight) hours as needed for nausea or vomiting.    Dispense:  20 tablet    Refill:  2  . meloxicam (MOBIC) 15 MG tablet    Sig: Take 1 tablet (15 mg total) by mouth daily.    Dispense:  30 tablet    Refill:  0    Followup 3-4 weeks or sooner if necessary  I have completed the patient encounter in its entirety as documented by the scribe, with editing by me where necessary. Lynsey Ange P. Merla Riches, M.D.

## 2014-09-08 ENCOUNTER — Ambulatory Visit (INDEPENDENT_AMBULATORY_CARE_PROVIDER_SITE_OTHER): Payer: Managed Care, Other (non HMO) | Admitting: Family Medicine

## 2014-09-08 VITALS — BP 108/78 | HR 105 | Temp 97.9°F | Resp 18 | Ht 61.0 in | Wt 112.0 lb

## 2014-09-08 DIAGNOSIS — D509 Iron deficiency anemia, unspecified: Secondary | ICD-10-CM

## 2014-09-08 DIAGNOSIS — R109 Unspecified abdominal pain: Secondary | ICD-10-CM

## 2014-09-08 DIAGNOSIS — N946 Dysmenorrhea, unspecified: Secondary | ICD-10-CM

## 2014-09-08 DIAGNOSIS — R112 Nausea with vomiting, unspecified: Secondary | ICD-10-CM

## 2014-09-08 LAB — POCT CBC
Granulocyte percent: 64.1 %G (ref 37–80)
HCT, POC: 35.5 % — AB (ref 37.7–47.9)
HEMOGLOBIN: 10.7 g/dL — AB (ref 12.2–16.2)
LYMPH, POC: 2 (ref 0.6–3.4)
MCH, POC: 19.2 pg — AB (ref 27–31.2)
MCHC: 30.2 g/dL — AB (ref 31.8–35.4)
MCV: 63.5 fL — AB (ref 80–97)
MID (cbc): 0.2 (ref 0–0.9)
MPV: 9.8 fL (ref 0–99.8)
POC Granulocyte: 4 (ref 2–6.9)
POC LYMPH PERCENT: 32.1 %L (ref 10–50)
POC MID %: 3.8 %M (ref 0–12)
Platelet Count, POC: 567 10*3/uL — AB (ref 142–424)
RBC: 5.59 M/uL — AB (ref 4.04–5.48)
RDW, POC: 15 %
WBC: 6.2 10*3/uL (ref 4.6–10.2)

## 2014-09-08 LAB — POCT UA - MICROSCOPIC ONLY
BACTERIA, U MICROSCOPIC: NEGATIVE
CASTS, UR, LPF, POC: NEGATIVE
Crystals, Ur, HPF, POC: NEGATIVE
MUCUS UA: NEGATIVE
Yeast, UA: NEGATIVE

## 2014-09-08 LAB — POCT URINALYSIS DIPSTICK
Bilirubin, UA: NEGATIVE
Glucose, UA: NEGATIVE
Leukocytes, UA: NEGATIVE
Nitrite, UA: NEGATIVE
PROTEIN UA: NEGATIVE
SPEC GRAV UA: 1.015
UROBILINOGEN UA: 0.2
pH, UA: 8.5

## 2014-09-08 MED ORDER — ONDANSETRON 4 MG PO TBDP: ORAL_TABLET | ORAL | Status: DC

## 2014-09-08 MED ORDER — KETOROLAC TROMETHAMINE 30 MG/ML IJ SOLN
30.0000 mg | Freq: Once | INTRAMUSCULAR | Status: AC
  Administered 2014-09-08: 30 mg via INTRAMUSCULAR

## 2014-09-08 MED ORDER — NAPROXEN 500 MG PO TABS: ORAL_TABLET | ORAL | Status: DC

## 2014-09-08 NOTE — Progress Notes (Signed)
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 She speaks adequate [place]. About 4 hours or ago the patient developed acute severe abdominal pain. This coincided with onset of her menstrual cycle. She complains of severe pain in the abdomen and through to her back. She has had some nausea and apparently has vomited dark could not get clear exactly how many times. She said she has had some loose stools. She has not had any fever. She apparently does get pain with her menstrual cycles other times. She's not taken any medications. She insists that she has to leave that she has a noncardiac last. She says she needs to take a test. She has had some surgery in the past, not an appendix, and does not it was for. She is not on any regular medications. Denies sexual involvement and has had no pregnancies. She lives with a brother.  Objective: 64 year old in significant abdominal distress, holding her abdomen and and writhing some with the pain. She remained fairly clothed although she pulled her gown on top of things. She has active bowel sounds. Abdomen is fairly soft. Very tender across the abdomen, especially low abdomen but up to the epigastrium and both CVA areas are tender.  Assessment: Acute abdominal pain Cultural barrier, Dysmenorrhea  Plan: I told her it was very important to check her out. She insists on leaving to go to class. I told her we will try and get the CBC and urinalysis as quickly as we could. We will give her an excuse.  Results for orders placed in visit on 09/08/14  POCT CBC      Result Value Ref Range   WBC 6.2  4.6 - 10.2 K/uL   Lymph, poc 2.0  0.6 - 3.4   POC LYMPH PERCENT 32.1  10 - 50 %L   MID (cbc) 0.2  0 - 0.9   POC MID % 3.8  0 - 12 %M   POC Granulocyte 4.0  2 - 6.9   Granulocyte percent 64.1  37 - 80 %G   RBC 5.59 (*) 4.04 - 5.48 M/uL   Hemoglobin 10.7 (*) 12.2 - 16.2 g/dL   HCT, POC 04.5 (*) 40.9 - 47.9 %   MCV 63.5 (*) 80 - 97 fL   MCH, POC  19.2 (*) 27 - 31.2 pg   MCHC 30.2 (*) 31.8 - 35.4 g/dL   RDW, POC 81.1     Platelet Count, POC 567 (*) 142 - 424 K/uL   MPV 9.8  0 - 99.8 fL  POCT UA - MICROSCOPIC ONLY      Result Value Ref Range   WBC, Ur, HPF, POC 0-1     RBC, urine, microscopic 0-2     Bacteria, U Microscopic neg     Mucus, UA neg     Epithelial cells, urine per micros 2-7     Crystals, Ur, HPF, POC neg     Casts, Ur, LPF, POC neg     Yeast, UA neg    POCT URINALYSIS DIPSTICK      Result Value Ref Range   Color, UA yellow     Clarity, UA clear     Glucose, UA neg     Bilirubin, UA neg     Ketones, UA trace     Spec Grav, UA 1.015     Blood, UA small     pH, UA 8.5     Protein, UA neg     Urobilinogen, UA 0.2  Nitrite, UA neg     Leukocytes, UA Negative     This looks like it is just dysmenorrhea. Will treat her accordingly.  I deficiency up. Microcytic anemia, will have her take some iron  ER if worse or return here

## 2014-09-08 NOTE — Patient Instructions (Addendum)
Take naprosyn 500 mg one twice daily for cramping and pain. Take with food  Take ondansetron if needed for nausea or vomiting  You have some anemia, probably from a low iron. Recommend taking over-the-counter iron which she can buy at the pharmacy one daily for the next 3 months.  If pain is not doing better should return or go to the emergency room. A special caution if you have fever or a lot of nausea and vomiting.   Dysmenorrhea Menstrual cramps (dysmenorrhea) are caused by the muscles of the uterus tightening (contracting) during a menstrual period. For some women, this discomfort is merely bothersome. For others, dysmenorrhea can be severe enough to interfere with everyday activities for a few days each month. Primary dysmenorrhea is menstrual cramps that last a couple of days when you start having menstrual periods or soon after. This often begins after a teenager starts having her period. As a woman gets older or has a baby, the cramps will usually lessen or disappear. Secondary dysmenorrhea begins later in life, lasts longer, and the pain may be stronger than primary dysmenorrhea. The pain may start before the period and last a few days after the period.  CAUSES  Dysmenorrhea is usually caused by an underlying problem, such as:  The tissue lining the uterus grows outside of the uterus in other areas of the body (endometriosis).  The endometrial tissue, which normally lines the uterus, is found in or grows into the muscular walls of the uterus (adenomyosis).  The pelvic blood vessels are engorged with blood just before the menstrual period (pelvic congestive syndrome).  Overgrowth of cells (polyps) in the lining of the uterus or cervix.  Falling down of the uterus (prolapse) because of loose or stretched ligaments.  Depression.  Bladder problems, infection, or inflammation.  Problems with the intestine, a tumor, or irritable bowel syndrome.  Cancer of the female organs or  bladder.  A severely tipped uterus.  A very tight opening or closed cervix.  Noncancerous tumors of the uterus (fibroids).  Pelvic inflammatory disease (PID).  Pelvic scarring (adhesions) from a previous surgery.  Ovarian cyst.  An intrauterine device (IUD) used for birth control. RISK FACTORS You may be at greater risk of dysmenorrhea if:  You are younger than age 37.  You started puberty early.  You have irregular or heavy bleeding.  You have never given birth.  You have a family history of this problem.  You are a smoker. SIGNS AND SYMPTOMS   Cramping or throbbing pain in your lower abdomen.  Headaches.  Lower back pain.  Nausea or vomiting.  Diarrhea.  Sweating or dizziness.  Loose stools. DIAGNOSIS  A diagnosis is based on your history, symptoms, physical exam, diagnostic tests, or procedures. Diagnostic tests or procedures may include:  Blood tests.  Ultrasonography.  An examination of the lining of the uterus (dilation and curettage, D&C).  An examination inside your abdomen or pelvis with a scope (laparoscopy).  X-rays.  CT scan.  MRI.  An examination inside the bladder with a scope (cystoscopy).  An examination inside the intestine or stomach with a scope (colonoscopy, gastroscopy). TREATMENT  Treatment depends on the cause of the dysmenorrhea. Treatment may include:  Pain medicine prescribed by your health care provider.  Birth control pills or an IUD with progesterone hormone in it.  Hormone replacement therapy.  Nonsteroidal anti-inflammatory drugs (NSAIDs). These may help stop the production of prostaglandins.  Surgery to remove adhesions, endometriosis, ovarian cyst, or fibroids.  Removal  of the uterus (hysterectomy).  Progesterone shots to stop the menstrual period.  Cutting the nerves on the sacrum that go to the female organs (presacral neurectomy).  Electric current to the sacral nerves (sacral nerve  stimulation).  Antidepressant medicine.  Psychiatric therapy, counseling, or group therapy.  Exercise and physical therapy.  Meditation and yoga therapy.  Acupuncture. HOME CARE INSTRUCTIONS   Only take over-the-counter or prescription medicines as directed by your health care provider.  Place a heating pad or hot water bottle on your lower back or abdomen. Do not sleep with the heating pad.  Use aerobic exercises, walking, swimming, biking, and other exercises to help lessen the cramping.  Massage to the lower back or abdomen may help.  Stop smoking.  Avoid alcohol and caffeine. SEEK MEDICAL CARE IF:   Your pain does not get better with medicine.  You have pain with sexual intercourse.  Your pain increases and is not controlled with medicines.  You have abnormal vaginal bleeding with your period.  You develop nausea or vomiting with your period that is not controlled with medicine. SEEK IMMEDIATE MEDICAL CARE IF:  You pass out.  Document Released: 12/03/2005 Document Revised: 08/05/2013 Document Reviewed: 05/21/2013 [facility] Patient Information 2015 [place], [place]. This information is not intended to replace advice given to you by your health care provider. Make sure you discuss any questions you have with your health care provider.

## 2014-11-24 ENCOUNTER — Ambulatory Visit (INDEPENDENT_AMBULATORY_CARE_PROVIDER_SITE_OTHER): Payer: Managed Care, Other (non HMO) | Admitting: Family Medicine

## 2014-11-24 VITALS — BP 102/70 | HR 77 | Temp 98.7°F | Resp 18 | Ht 62.0 in | Wt 109.0 lb

## 2014-11-24 DIAGNOSIS — M549 Dorsalgia, unspecified: Secondary | ICD-10-CM

## 2014-11-24 DIAGNOSIS — R109 Unspecified abdominal pain: Secondary | ICD-10-CM

## 2014-11-24 DIAGNOSIS — D509 Iron deficiency anemia, unspecified: Secondary | ICD-10-CM

## 2014-11-24 DIAGNOSIS — M79605 Pain in left leg: Secondary | ICD-10-CM

## 2014-11-24 DIAGNOSIS — M79604 Pain in right leg: Secondary | ICD-10-CM

## 2014-11-24 DIAGNOSIS — N946 Dysmenorrhea, unspecified: Secondary | ICD-10-CM

## 2014-11-24 LAB — POCT UA - MICROSCOPIC ONLY
BACTERIA, U MICROSCOPIC: NEGATIVE
CASTS, UR, LPF, POC: NEGATIVE
Crystals, Ur, HPF, POC: NEGATIVE
Mucus, UA: NEGATIVE
Yeast, UA: NEGATIVE

## 2014-11-24 LAB — POCT CBC
Granulocyte percent: 55.4 %G (ref 37–80)
HCT, POC: 31.6 % — AB (ref 37.7–47.9)
HEMOGLOBIN: 9.7 g/dL — AB (ref 12.2–16.2)
Lymph, poc: 1.9 (ref 0.6–3.4)
MCH, POC: 19.4 pg — AB (ref 27–31.2)
MCHC: 30.7 g/dL — AB (ref 31.8–35.4)
MCV: 63.3 fL — AB (ref 80–97)
MID (cbc): 0.3 (ref 0–0.9)
MPV: 10 fL (ref 0–99.8)
POC GRANULOCYTE: 2.7 (ref 2–6.9)
POC LYMPH PERCENT: 38.1 %L (ref 10–50)
POC MID %: 6.5 %M (ref 0–12)
Platelet Count, POC: 519 10*3/uL — AB (ref 142–424)
RBC: 4.99 M/uL (ref 4.04–5.48)
RDW, POC: 15.1 %
WBC: 4.9 10*3/uL (ref 4.6–10.2)

## 2014-11-24 LAB — POCT URINALYSIS DIPSTICK
Bilirubin, UA: NEGATIVE
Glucose, UA: NEGATIVE
KETONES UA: 15
Nitrite, UA: NEGATIVE
Protein, UA: 100
SPEC GRAV UA: 1.015
Urobilinogen, UA: 0.2
pH, UA: 8.5

## 2014-11-24 LAB — POCT SEDIMENTATION RATE: POCT SED RATE: 18 mm/hr (ref 0–22)

## 2014-11-24 MED ORDER — KETOROLAC TROMETHAMINE 30 MG/ML IJ SOLN
30.0000 mg | Freq: Once | INTRAMUSCULAR | Status: AC
  Administered 2014-11-24: 30 mg via INTRAMUSCULAR

## 2014-11-24 MED ORDER — DICLOFENAC SODIUM 75 MG PO TBEC: 75.0000 mg | DELAYED_RELEASE_TABLET | Freq: Two times a day (BID) | ORAL | Status: DC

## 2014-11-24 NOTE — Progress Notes (Signed)
Subjective: This young lady is here from [place] studying [employer]. She's been having a lot of trouble with back pain and aching ever since it has been getting cool. She's been having bad dysmenorrhea since yesterday. Pain across the low abdomen. No dysuria. No major bowel symptoms though had a little loose stools yesterday. No nausea or vomiting. Took 2 ibuprofen yesterday. She takes some regular dose of folic acid for a history of anemia.  Objective:  Supple. Abdomen has active bowel sounds. Soft without masses. Is tender across lower abdomen. Tender in the upper back muscles and CVA area. Straight leg raising test causes her a lot of pain in her legs.  Assessment: Dysmenorrhea Leg pains and back pain Low abdominal pain History of anemia  Plan: CBC, sedimentation rate, urinalysis  Results for orders placed or performed in visit on 11/24/14  POCT CBC  Result Value Ref Range   WBC 4.9 4.6 - 10.2 K/uL   Lymph, poc 1.9 0.6 - 3.4   POC LYMPH PERCENT 38.1 10 - 50 %L   MID (cbc) 0.3 0 - 0.9   POC MID % 6.5 0 - 12 %M   POC Granulocyte 2.7 2 - 6.9   Granulocyte percent 55.4 37 - 80 %G   RBC 4.99 4.04 - 5.48 M/uL   Hemoglobin 9.7 (A) 12.2 - 16.2 g/dL   HCT, POC 16.131.6 (A) 09.637.7 - 47.9 %   MCV 63.3 (A) 80 - 97 fL   MCH, POC 19.4 (A) 27 - 31.2 pg   MCHC 30.7 (A) 31.8 - 35.4 g/dL   RDW, POC 04.515.1 %   Platelet Count, POC 519 (A) 142 - 424 K/uL   MPV 10.0 0 - 99.8 fL  POCT UA - Microscopic Only  Result Value Ref Range   WBC, Ur, HPF, POC 4-6    RBC, urine, microscopic TNTC    Bacteria, U Microscopic NEG    Mucus, UA NEG    Epithelial cells, urine per micros 1-3    Crystals, Ur, HPF, POC NEG    Casts, Ur, LPF, POC NEG    Yeast, UA NEG   POCT urinalysis dipstick  Result Value Ref Range   Color, UA YELLOW    Clarity, UA CLOUDY    Glucose, UA NEG    Bilirubin, UA NEG    Ketones, UA 15    Spec Grav, UA 1.015    Blood, UA LARGE    pH, UA 8.5    Protein, UA 100    Urobilinogen, UA 0.2    Nitrite, UA NEG    Leukocytes, UA Trace    It appears to be a microcytic anemia consistent with iron deficiency anemia. Will add medications. Also check a ferritin level.

## 2014-11-24 NOTE — Patient Instructions (Signed)
Take diclofenac one pill twice daily at breakfast and supper for the low abdominal menstrual cramps. When your menstrual cycle is finished you can stop taking these. Save the remainder to use next time you have a menstrual period.  In addition to the diclofenac you can take 2 Tylenol 3 times a day if needed for pain  Drink lots of water  For the anemia, begin taking an over-the-counter iron pill. Ask the pharmacist to give you a good iron medicine. Take one pill twice daily for one month, then once daily. Iron medicine will sometimes make your stool looked dark or greenish.  Also purchase over-the-counter a One-A-Day vitamin to take daily.  Return in about 3 months to let me recheck your blood count. You can call the office and find out when I am scheduled to be in the office, and when you sign in ask for me and I'll be happy to recheck on this.            Iron Deficiency Anemia Anemia is a condition in which there are less red blood cells or hemoglobin in the blood than normal. Hemoglobin is the part of red blood cells that carries oxygen. Iron deficiency anemia is anemia caused by too little iron. It is the most common type of anemia. It may leave you tired and short of breath. CAUSES   Lack of iron in the diet.  Poor absorption of iron, as seen with intestinal disorders.  Intestinal bleeding.  Heavy periods. SIGNS AND SYMPTOMS  Mild anemia may not be noticeable. Symptoms may include:  Fatigue.  Headache.  Pale skin.  Weakness.  Tiredness.  Shortness of breath.  Dizziness.  Cold hands and feet.  Fast or irregular heartbeat. DIAGNOSIS  Diagnosis requires a thorough evaluation and physical exam by your health care provider. Blood tests are generally used to confirm iron deficiency anemia. Additional tests may be done to find the underlying cause of your anemia. These may include:  Testing for blood in the stool (fecal occult blood test).  A procedure to see  inside the colon and rectum (colonoscopy).  A procedure to see inside the esophagus and stomach (endoscopy). TREATMENT  Iron deficiency anemia is treated by correcting the cause of the deficiency. Treatment may involve:  Adding iron-rich foods to your diet.  Taking iron supplements. Pregnant or breastfeeding women need to take extra iron because their normal diet usually does not provide the required amount.  Taking vitamins. Vitamin C improves the absorption of iron. Your health care provider may recommend that you take your iron tablets with a glass of orange juice or vitamin C supplement.  Medicines to make heavy menstrual flow lighter.  Surgery. HOME CARE INSTRUCTIONS   Take iron as directed by your health care provider.  If you cannot tolerate taking iron supplements by mouth, talk to your health care provider about taking them through a vein (intravenously) or an injection into a muscle.  For the best iron absorption, iron supplements should be taken on an empty stomach. If you cannot tolerate them on an empty stomach, you may need to take them with food.  Do not drink milk or take antacids at the same time as your iron supplements. Milk and antacids may interfere with the absorption of iron.  Iron supplements can cause constipation. Make sure to include fiber in your diet to prevent constipation. A stool softener may also be recommended.  Take vitamins as directed by your health care provider.  Eat a  diet rich in iron. Foods high in iron include liver, lean beef, whole-grain bread, eggs, dried fruit, and dark green leafy vegetables. SEEK IMMEDIATE MEDICAL CARE IF:   You faint. If this happens, do not drive. Call your local emergency services (911 in [place] if no other help is available.  You have chest pain.  You feel nauseous or vomit.  You have severe or increased shortness of breath with activity.  You feel weak.  You have a rapid heartbeat.  You have unexplained  sweating.  You become light-headed when getting up from a chair or bed. MAKE SURE YOU:   Understand these instructions.  Will watch your condition.  Will get help right away if you are not doing well or get worse. Document Released: 11/30/2000 Document Revised: 12/08/2013 Document Reviewed: 08/10/2013 [facility] Patient Information 2015 [place], [place]. This information is not intended to replace advice given to you by your health care provider. Make sure you discuss any questions you have with your health care provider. Dysmenorrhea Menstrual cramps (dysmenorrhea) are caused by the muscles of the uterus tightening (contracting) during a menstrual period. For some women, this discomfort is merely bothersome. For others, dysmenorrhea can be severe enough to interfere with everyday activities for a few days each month. Primary dysmenorrhea is menstrual cramps that last a couple of days when you start having menstrual periods or soon after. This often begins after a teenager starts having her period. As a woman gets older or has a baby, the cramps will usually lessen or disappear. Secondary dysmenorrhea begins later in life, lasts longer, and the pain may be stronger than primary dysmenorrhea. The pain may start before the period and last a few days after the period.  CAUSES  Dysmenorrhea is usually caused by an underlying problem, such as:  The tissue lining the uterus grows outside of the uterus in other areas of the body (endometriosis).  The endometrial tissue, which normally lines the uterus, is found in or grows into the muscular walls of the uterus (adenomyosis).  The pelvic blood vessels are engorged with blood just before the menstrual period (pelvic congestive syndrome).  Overgrowth of cells (polyps) in the lining of the uterus or cervix.  Falling down of the uterus (prolapse) because of loose or stretched ligaments.  Depression.  Bladder problems, infection, or  inflammation.  Problems with the intestine, a tumor, or irritable bowel syndrome.  Cancer of the female organs or bladder.  A severely tipped uterus.  A very tight opening or closed cervix.  Noncancerous tumors of the uterus (fibroids).  Pelvic inflammatory disease (PID).  Pelvic scarring (adhesions) from a previous surgery.  Ovarian cyst.  An intrauterine device (IUD) used for birth control. RISK FACTORS You may be at greater risk of dysmenorrhea if:  You are younger than age 20.  You started puberty early.  You have irregular or heavy bleeding.  You have never given birth.  You have a family history of this problem.  You are a smoker. SIGNS AND SYMPTOMS   Cramping or throbbing pain in your lower abdomen.  Headaches.  Lower back pain.  Nausea or vomiting.  Diarrhea.  Sweating or dizziness.  Loose stools. DIAGNOSIS  A diagnosis is based on your history, symptoms, physical exam, diagnostic tests, or procedures. Diagnostic tests or procedures may include:  Blood tests.  Ultrasonography.  An examination of the lining of the uterus (dilation and curettage, D&C).  An examination inside your abdomen or pelvis with a scope (laparoscopy).  X-rays.  CT scan.  MRI.  An examination inside the bladder with a scope (cystoscopy).  An examination inside the intestine or stomach with a scope (colonoscopy, gastroscopy). TREATMENT  Treatment depends on the cause of the dysmenorrhea. Treatment may include:  Pain medicine prescribed by your health care provider.  Birth control pills or an IUD with progesterone hormone in it.  Hormone replacement therapy.  Nonsteroidal anti-inflammatory drugs (NSAIDs). These may help stop the production of prostaglandins.  Surgery to remove adhesions, endometriosis, ovarian cyst, or fibroids.  Removal of the uterus (hysterectomy).  Progesterone shots to stop the menstrual period.  Cutting the nerves on the sacrum that  go to the female organs (presacral neurectomy).  Electric current to the sacral nerves (sacral nerve stimulation).  Antidepressant medicine.  Psychiatric therapy, counseling, or group therapy.  Exercise and physical therapy.  Meditation and yoga therapy.  Acupuncture. HOME CARE INSTRUCTIONS   Only take over-the-counter or prescription medicines as directed by your health care provider.  Place a heating pad or hot water bottle on your lower back or abdomen. Do not sleep with the heating pad.  Use aerobic exercises, walking, swimming, biking, and other exercises to help lessen the cramping.  Massage to the lower back or abdomen may help.  Stop smoking.  Avoid alcohol and caffeine. SEEK MEDICAL CARE IF:   Your pain does not get better with medicine.  You have pain with sexual intercourse.  Your pain increases and is not controlled with medicines.  You have abnormal vaginal bleeding with your period.  You develop nausea or vomiting with your period that is not controlled with medicine. SEEK IMMEDIATE MEDICAL CARE IF:  You pass out.  Document Released: 12/03/2005 Document Revised: 08/05/2013 Document Reviewed: 05/21/2013 [facility] Patient Information 2015 [place], [place]. This information is not intended to replace advice given to you by your health care provider. Make sure you discuss any questions you have with your health care provider.

## 2014-12-03 NOTE — Progress Notes (Signed)
redraw

## 2014-12-03 NOTE — Progress Notes (Signed)
Please have her return for redraw.

## 2014-12-06 ENCOUNTER — Telehealth: Payer: Self-pay | Admitting: *Deleted

## 2014-12-06 DIAGNOSIS — D649 Anemia, unspecified: Secondary | ICD-10-CM

## 2014-12-06 NOTE — Telephone Encounter (Signed)
Call patient: I failed to do the additional lab test to check her iron levels.  Tell her if she could stop by needs lab only visit to check ferritin level.  Please put in the order.

## 2014-12-06 NOTE — Telephone Encounter (Signed)
This is in regards to the ferritin

## 2014-12-06 NOTE — Telephone Encounter (Signed)
Order placed. LM for pt to RTC for lab only

## 2014-12-06 NOTE — Telephone Encounter (Signed)
-----   Message from Alden BenjaminAmy Cecil, [place] sent at 11/29/2014  1:14 PM EST ----- Called and spoke to [facility] in regards to this lab. They said they never received the specimen for this particular test. Would you like for us to call and have them return for redraw ?

## 2015-02-01 ENCOUNTER — Ambulatory Visit (INDEPENDENT_AMBULATORY_CARE_PROVIDER_SITE_OTHER): Payer: PPO | Admitting: Emergency Medicine

## 2015-02-01 VITALS — BP 102/70 | HR 75 | Temp 97.8°F | Resp 16 | Ht 62.0 in | Wt 110.0 lb

## 2015-02-01 DIAGNOSIS — R1084 Generalized abdominal pain: Secondary | ICD-10-CM

## 2015-02-01 DIAGNOSIS — N946 Dysmenorrhea, unspecified: Secondary | ICD-10-CM

## 2015-02-01 DIAGNOSIS — D509 Iron deficiency anemia, unspecified: Secondary | ICD-10-CM

## 2015-02-01 LAB — POCT URINE PREGNANCY: Preg Test, Ur: NEGATIVE

## 2015-02-01 LAB — POCT URINALYSIS DIPSTICK
BILIRUBIN UA: NEGATIVE
Glucose, UA: NEGATIVE
KETONES UA: NEGATIVE
Nitrite, UA: NEGATIVE
Protein, UA: NEGATIVE
SPEC GRAV UA: 1.015
Urobilinogen, UA: 0.2
pH, UA: 8.5

## 2015-02-01 LAB — POCT CBC
Granulocyte percent: 61.2 %G (ref 37–80)
HCT, POC: 34.7 % — AB (ref 37.7–47.9)
Hemoglobin: 9.9 g/dL — AB (ref 12.2–16.2)
LYMPH, POC: 2 (ref 0.6–3.4)
MCH: 18.6 pg — AB (ref 27–31.2)
MCHC: 28.6 g/dL — AB (ref 31.8–35.4)
MCV: 65.1 fL — AB (ref 80–97)
MID (CBC): 0.3 (ref 0–0.9)
MPV: 9.9 fL (ref 0–99.8)
PLATELET COUNT, POC: 495 10*3/uL — AB (ref 142–424)
POC Granulocyte: 3.6 (ref 2–6.9)
POC LYMPH %: 34 % (ref 10–50)
POC MID %: 4.8 %M (ref 0–12)
RBC: 5.33 M/uL (ref 4.04–5.48)
RDW, POC: 15.6 %
WBC: 5.9 10*3/uL (ref 4.6–10.2)

## 2015-02-01 LAB — POCT UA - MICROSCOPIC ONLY
CASTS, UR, LPF, POC: NEGATIVE
Crystals, Ur, HPF, POC: NEGATIVE
Mucus, UA: NEGATIVE
YEAST UA: POSITIVE

## 2015-02-01 MED ORDER — NAPROXEN SODIUM 550 MG PO TABS: 550.0000 mg | ORAL_TABLET | Freq: Two times a day (BID) | ORAL | Status: AC

## 2015-02-01 MED ORDER — KETOROLAC TROMETHAMINE 60 MG/2ML IM SOLN
60.0000 mg | Freq: Once | INTRAMUSCULAR | Status: AC
  Administered 2015-02-01: 60 mg via INTRAMUSCULAR

## 2015-02-01 MED ORDER — LEVONORGESTREL-ETHINYL ESTRAD 0.1-20 MG-MCG PO TABS: 1.0000 | ORAL_TABLET | Freq: Every day | ORAL | Status: DC

## 2015-02-01 NOTE — Progress Notes (Signed)
I had a 15 min conversation with the patient regarding ways of treating dysmenorrhea.  She has tried multiple NSAIDs without much help.  She states that the shot she gets here is the only things that helps the pain.  Currently she is only taking Zofran (she did not know that this was for nausea).  Her menses is about 6 days long and very heavy,  She starts cramping about 1 week prior to her menses starts and then cramps throughout her entire menses.  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  She only plans on going home yearly around the Christmas holiday.  Her father is not supportive of her being on OCPs but she wants to try them because she is not functional during almost 2 weeks out of the month.  Her menses are not completely regular so I think that   pre-treating for cramps would not be successful. We are going to start Aviane today.  We talked about how to start and what to expect.  She will see me in 2 months.

## 2015-02-01 NOTE — Patient Instructions (Signed)
Start your pills today and make sure you take a pill every day not matter if you are bleeding or not.  Please see me Adrienne Hawkins(Adrienne Hawkins) in about 2 and a half months to see how you are doing.  I will give you refills of the medication at that time.

## 2015-02-01 NOTE — Progress Notes (Signed)
[facility] [address] [place] [address] [phone]  Date:  02/01/2015   Name:  Adrienne Hawkins   DOB:  [birth year]   MRN:  [id number]  PCP:  No PCP Per Patient    Chief Complaint: Abdominal Pain and Back Pain   History of Present Illness:  Adrienne Hawkins is a 19 y.o. very pleasant female patient who presents with the following:  Seen previously for dysmenorrhea.  Refused OCP to manage menses and cramps Became ill with abdominal pain at 0600 today. Started menses today.  Says LMP was in December 2015. Some dysuria. No urgency or frequency Says not sexually active.  Denies vaginal discharge. Has SCT with some chronic anemia. No improvement with over the counter medications or other home remedies.  Denies other complaint or health concern today.   Patient Active Problem List   Diagnosis Date Noted  . Reaction, adjustment, with anxious, depressed mood 06/29/2014    Past Medical History  Diagnosis Date  . Sickle cell anemia     doesnt really know  . Anemia     History reviewed. No pertinent past surgical history.  History  Substance Use Topics  . Smoking status: Never Smoker   . Smokeless tobacco: Not on file  . Alcohol Use: No    History reviewed. No pertinent family history.  No Known Allergies  Medication list has been reviewed and updated.  Current Outpatient Prescriptions on File Prior to Visit  Medication Sig Dispense Refill  . folic acid (FOLVITE) 1 MG tablet Take 1 mg by mouth daily.    . diclofenac (VOLTAREN) 75 MG EC tablet Take 1 tablet (75 mg total) by mouth 2 (two) times daily. (Patient not taking: Reported on 02/01/2015) 30 tablet 1  . ibuprofen (ADVIL,MOTRIN) 800 MG tablet Take 1 tablet (800 mg total) by mouth 3 (three) times daily. (Patient not taking: Reported on 02/01/2015) 21 tablet 0  . meloxicam (MOBIC) 15 MG tablet Take 1 tablet (15 mg total) by mouth daily. (Patient not taking: Reported on 11/24/2014) 30 tablet 0  .  naproxen (NAPROSYN) 500 MG tablet Take one twice daily after breakfast and supper as needed for menstrual cramps and pain (Patient not taking: Reported on 11/24/2014) 30 tablet 0   No current facility-administered medications on file prior to visit.    Review of Systems:  As per HPI, otherwise negative.    Physical Examination: Filed Vitals:   02/01/15 1427  BP: 102/70  Pulse: 75  Temp: 97.8 F (36.6 C)  Resp: 16   Filed Vitals:   02/01/15 1427  Height:  (1.575 m)  Weight: 110 lb (49.896 kg)   Body mass index is 20.11 kg/(m^2). Ideal Body Weight: Weight in (lb) to have BMI = 25: 136.4  GEN: WDWN, moderately distressed, Non-toxic, A & O x 3 HEENT: Atraumatic, Normocephalic. Neck supple. No masses, No LAD. Ears and Nose: No external deformity. CV: RRR, No M/G/R. No JVD. No thrill. No extra heart sounds. PULM: CTA B, no wheezes, crackles, rhonchi. No retractions. No resp. distress. No accessory muscle use. ABD: S, tender diffusely mostly in suprapubic region, ND, +BS. No rebound. No HSM. EXTR: No c/c/e NEURO Normal gait.  PSYCH: Normally interactive. Conversant. Not depressed or anxious appearing.  Calm demeanor.    Assessment and Plan: Dysmenorrhea toradol Anaprox Follow up with Benny Lennert  Signed,  Phillips Odor, MD   Results for orders placed or performed in visit on 02/01/15  POCT CBC  Result Value Ref Range   WBC 5.9 4.6 - 10.2 K/uL   Lymph, poc 2.0 0.6 - 3.4   POC LYMPH PERCENT 34.0 10 - 50 %L   MID (cbc) 0.3 0 - 0.9   POC MID % 4.8 0 - 12 %M   POC Granulocyte 3.6 2 - 6.9   Granulocyte percent 61.2 37 - 80 %G   RBC 5.33 4.04 - 5.48 M/uL   Hemoglobin 9.9 (A) 12.2 - 16.2 g/dL   HCT, POC 16.134.7 (A) 09.637.7 - 47.9 %   MCV 65.1 (A) 80 - 97 fL   MCH, POC 18.6 (A) 27 - 31.2 pg   MCHC 28.6 (A) 31.8 - 35.4 g/dL   RDW, POC 04.515.6 %   Platelet Count, POC 495 (A) 142 - 424 K/uL   MPV 9.9 0 - 99.8 fL  POCT UA - Microscopic Only  Result Value Ref Range    WBC, Ur, HPF, POC 0-2    RBC, urine, microscopic TNTC    Bacteria, U Microscopic trace    Mucus, UA negative    Epithelial cells, urine per micros 1-4    Crystals, Ur, HPF, POC negative    Casts, Ur, LPF, POC negative    Yeast, UA positive   POCT urinalysis dipstick  Result Value Ref Range   Color, UA yellow    Clarity, UA cloudy    Glucose, UA negative    Bilirubin, UA negative    Ketones, UA negative    Spec Grav, UA 1.015    Blood, UA large    pH, UA 8.5    Protein, UA negative    Urobilinogen, UA 0.2    Nitrite, UA negative    Leukocytes, UA Trace   POCT urine pregnancy  Result Value Ref Range   Preg Test, Ur Negative

## 2015-03-07 ENCOUNTER — Other Ambulatory Visit: Payer: Self-pay | Admitting: Family Medicine

## 2015-03-07 ENCOUNTER — Ambulatory Visit (INDEPENDENT_AMBULATORY_CARE_PROVIDER_SITE_OTHER): Payer: PPO | Admitting: Family Medicine

## 2015-03-07 VITALS — BP 108/68 | HR 90 | Temp 98.4°F | Resp 17 | Ht 61.5 in | Wt 105.0 lb

## 2015-03-07 DIAGNOSIS — F430 Acute stress reaction: Secondary | ICD-10-CM

## 2015-03-07 DIAGNOSIS — R140 Abdominal distension (gaseous): Secondary | ICD-10-CM

## 2015-03-07 DIAGNOSIS — G4700 Insomnia, unspecified: Secondary | ICD-10-CM | POA: Diagnosis not present

## 2015-03-07 DIAGNOSIS — D509 Iron deficiency anemia, unspecified: Secondary | ICD-10-CM | POA: Diagnosis not present

## 2015-03-07 LAB — POCT CBC
Granulocyte percent: 49.3 %G (ref 37–80)
HEMATOCRIT: 35.1 % — AB (ref 37.7–47.9)
Hemoglobin: 10.5 g/dL — AB (ref 12.2–16.2)
LYMPH, POC: 2.7 (ref 0.6–3.4)
MCH, POC: 19 pg — AB (ref 27–31.2)
MCHC: 29.9 g/dL — AB (ref 31.8–35.4)
MCV: 63.5 fL — AB (ref 80–97)
MID (CBC): 0.2 (ref 0–0.9)
MPV: 10.8 fL (ref 0–99.8)
PLATELET COUNT, POC: 495 10*3/uL — AB (ref 142–424)
POC Granulocyte: 2.9 (ref 2–6.9)
POC LYMPH PERCENT: 47.1 %L (ref 10–50)
POC MID %: 3.6 %M (ref 0–12)
RBC: 5.52 M/uL — AB (ref 4.04–5.48)
RDW, POC: 15 %
WBC: 5.8 10*3/uL (ref 4.6–10.2)

## 2015-03-07 LAB — POCT UA - MICROSCOPIC ONLY
CASTS, UR, LPF, POC: NEGATIVE
CRYSTALS, UR, HPF, POC: NEGATIVE
Mucus, UA: NEGATIVE
Yeast, UA: NEGATIVE

## 2015-03-07 LAB — POCT URINALYSIS DIPSTICK
Bilirubin, UA: NEGATIVE
Glucose, UA: NEGATIVE
Ketones, UA: 15
Nitrite, UA: NEGATIVE
SPEC GRAV UA: 1.015
UROBILINOGEN UA: 0.2
pH, UA: 8.5

## 2015-03-07 LAB — POCT URINE PREGNANCY: PREG TEST UR: NEGATIVE

## 2015-03-07 NOTE — Patient Instructions (Signed)
I will be in touch with your labs If you are getting worse or having any other concerns please let me know! You can go back on your pill now- it does not matter that you are still spotting. You can simply continue to take one pill a day Within 2-3 months I expect that your periods will be lighter and much less painful Try taking melatonin nightly before bed- take it 30 minutes or so before you would like to be asleep Also try to keep a good sleep pattern- take some time to relax before trying to sleep and try to avoid letting anything upset you.

## 2015-03-07 NOTE — Progress Notes (Addendum)
Subjective:    Patient ID: Adrienne Hawkins, female    DOB: 30-Mar-1996, 19 y.o.   MRN: [id number]  Prior to Admission medications   Medication Sig Start Date End Date Taking? Authorizing Provider  folic acid (FOLVITE) 1 MG tablet Take 1 mg by mouth daily.   Yes Historical Provider, MD  levonorgestrel-ethinyl estradiol (AVIANE) 0.1-20 MG-MCG tablet Take 1 tablet by mouth daily. 02/01/15  Yes Morrell Riddle, PA-C  naproxen sodium (ANAPROX DS) 550 MG tablet Take 1 tablet (550 mg total) by mouth 2 (two) times daily with a meal. 02/01/15 02/01/16 Yes [name], MD     HPI Note and evaluation begun by Raelyn Ensign, PA-C. However I took over her care when pt admitted that she was more comfortable with a female provider given her religious beliefs.   Here today to discuss concern about her menses, as well as abd bloating.  Saw Dr. Dareen Piano just over a month ago and was started on OCP due to painful menses.  She noted a much easier time while on OCP - noted that her pain resolved.  However she was concerned that she did not seem to get a real period- she had a lot of spotting over the last month and currently.    She has noted decreased appetite lately but no abd pain. No diarrhea, n/v. No fever, chills. Normal bm every day.   She has been really stressed lately- she is here from [place] studying on a grant of some type.  However it seems that her funding fell through, and she is having a hard time getting the money together to fly back home.  She now plans to go back home in about 2 weeks and is eager to get back to her more familiar environment.  She has noted insomnia for a coupleof weeks.  Waking in middle of night and having trouble falling back asleep. Feels that heart is punding when awakes in middle of night. She is somewhat tearful and knows that she is sad- however she feels that her sx are situational and will soon resolve so she does not desire to start any medication at this time.   She does not want to take anything that could be habit forming for her.     She states no risk of pregnancy or STI as she has never had sex.   She finished her first pack of pills one week ago- she is still having some spotting so she was not sure if she should start on her next pack or not.    She does have a history of anemia.   Review of Systems    As per HPI- otherwise negative.  Objective:   Physical Exam  GEN: WDWN, NAD, Non-toxic, A & O x 3, looks well, slim build HEENT: Atraumatic, Normocephalic. Neck supple. No masses, No LAD. Ears and Nose: No external deformity. CV: RRR, No M/G/R. No JVD. No thrill. No extra heart sounds. PULM: CTA B, no wheezes, crackles, rhonchi. No retractions. No resp. distress. No accessory muscle use. ABD: S, NT, ND, +BS. No rebound. No HSM.  No unusual bloating noted, abd appears normal EXTR: No c/c/e NEURO Normal gait.  PSYCH: Normally interactive. Conversant. Not depressed or anxious appearing.  Calm demeanor.   BP Readings from Last 3 Encounters:  03/07/15 108/68  02/01/15 102/70  11/24/14 102/70   Wt Readings from Last 3 Encounters:  03/07/15 105 lb (47.628 kg)  02/01/15 110 lb (49.896 kg)  11/24/14 109 lb (49.442 kg)       Assessment & Plan:  Abdominal bloating - Plan: POCT CBC, POCT urinalysis dipstick, POCT urine pregnancy, POCT UA - Microscopic Only, Comprehensive metabolic panel  Insomnia  Stress reaction  Microcytic anemia  Here today with complaint of bloating, menstrual spotting and sadness/ insomnia.  She is willing to try some melatonin for sleep.  She will let me know if she is not able to get home as planned or if she has any other concerns regarding depression and anxiety Check a ferritin; suspect that she needs iron replacement Await further labs; reassured that she is ok to just continue her OCP despite some spotting.  Suspect that her feeling of bloating will resolve, and OCP will be helpful in reducing her  menstrual sx and blood volume loss.    Received the rest of her labs on 3/22: see all labs below.  Ferritin is pending so will call her when this comes in  Results for orders placed or performed in visit on 03/07/15  Comprehensive metabolic panel  Result Value Ref Range   Sodium 137 135 - 145 mEq/L   Potassium 4.2 3.5 - 5.3 mEq/L   Chloride 103 96 - 112 mEq/L   CO2 28 19 - 32 mEq/L   Glucose, Bld 93 70 - 99 mg/dL   BUN 9 6 - 23 mg/dL   Creat 4.090.65 8.110.50 - 9.141.10 mg/dL   Total Bilirubin 0.5 0.2 - 1.2 mg/dL   Alkaline Phosphatase 36 (L) 39 - 117 U/L   AST 10 0 - 37 U/L   ALT <8 0 - 35 U/L   Total Protein 7.3 6.0 - 8.3 g/dL   Albumin 4.7 3.5 - 5.2 g/dL   Calcium 9.9 8.4 - 78.210.5 mg/dL  POCT CBC  Result Value Ref Range   WBC 5.8 4.6 - 10.2 K/uL   Lymph, poc 2.7 0.6 - 3.4   POC LYMPH PERCENT 47.1 10 - 50 %L   MID (cbc) 0.2 0 - 0.9   POC MID % 3.6 0 - 12 %M   POC Granulocyte 2.9 2 - 6.9   Granulocyte percent 49.3 37 - 80 %G   RBC 5.52 (A) 4.04 - 5.48 M/uL   Hemoglobin 10.5 (A) 12.2 - 16.2 g/dL   HCT, POC 95.635.1 (A) 21.337.7 - 47.9 %   MCV 63.5 (A) 80 - 97 fL   MCH, POC 19.0 (A) 27 - 31.2 pg   MCHC 29.9 (A) 31.8 - 35.4 g/dL   RDW, POC 08.615.0 %   Platelet Count, POC 495 (A) 142 - 424 K/uL   MPV 10.8 0 - 99.8 fL  POCT urinalysis dipstick  Result Value Ref Range   Color, UA yellow    Clarity, UA clear    Glucose, UA neg    Bilirubin, UA neg    Ketones, UA 15    Spec Grav, UA 1.015    Blood, UA large    pH, UA 8.5    Protein, UA trace    Urobilinogen, UA 0.2    Nitrite, UA neg    Leukocytes, UA Trace   POCT urine pregnancy  Result Value Ref Range   Preg Test, Ur Negative   POCT UA - Microscopic Only  Result Value Ref Range   WBC, Ur, HPF, POC 2-4    RBC, urine, microscopic 3-5    Bacteria, U Microscopic trace    Mucus, UA neg    Epithelial cells, urine per micros 8-10  Crystals, Ur, HPF, POC neg    Casts, Ur, LPF, POC neg    Yeast, UA neg

## 2015-03-08 ENCOUNTER — Telehealth: Payer: Self-pay | Admitting: Family Medicine

## 2015-03-08 LAB — COMPREHENSIVE METABOLIC PANEL WITH GFR
ALT: <8 U/L (ref 0–35)
AST: 10 U/L (ref 0–37)
Albumin: 4.7 g/dL (ref 3.5–5.2)
Alkaline Phosphatase: 36 U/L — ABNORMAL LOW (ref 39–117)
BUN: 9 mg/dL (ref 6–23)
CO2: 28 meq/L (ref 19–32)
Calcium: 9.9 mg/dL (ref 8.4–10.5)
Chloride: 103 meq/L (ref 96–112)
Creat: 0.65 mg/dL (ref 0.50–1.10)
Glucose, Bld: 93 mg/dL (ref 70–99)
Potassium: 4.2 meq/L (ref 3.5–5.3)
Sodium: 137 meq/L (ref 135–145)
Total Bilirubin: 0.5 mg/dL (ref 0.2–1.2)
Total Protein: 7.3 g/dL (ref 6.0–8.3)

## 2015-03-08 LAB — FERRITIN: Ferritin: 20 ng/mL (ref 10–291)

## 2015-03-08 NOTE — Telephone Encounter (Signed)
Called [facility] an added on ferritin

## 2015-03-09 ENCOUNTER — Telehealth: Payer: Self-pay | Admitting: Family Medicine

## 2015-03-09 DIAGNOSIS — D509 Iron deficiency anemia, unspecified: Secondary | ICD-10-CM

## 2015-03-09 MED ORDER — FERROUS SULFATE 325 (65 FE) MG PO TABS: 325.0000 mg | ORAL_TABLET | Freq: Every day | ORAL | Status: DC

## 2015-03-09 NOTE — Telephone Encounter (Signed)
Called her back and was able to reach her.  Advised that her labs look good except she is low on iron; this is likely the cause of her anemia. She would like an rx for iron, did this for her.  Also advised that her platelets are a bit high.  Advised her to recheck her CBC in 1-2 months

## 2015-03-09 NOTE — Telephone Encounter (Signed)
Called her regarding the rest of her labs; however phone just beeped without any message, not sure if this is her phone. Will try again  Ferritin 20- low side of normal  Results for orders placed or performed in visit on 03/07/15  Comprehensive metabolic panel  Result Value Ref Range   Sodium 137 135 - 145 mEq/L   Potassium 4.2 3.5 - 5.3 mEq/L   Chloride 103 96 - 112 mEq/L   CO2 28 19 - 32 mEq/L   Glucose, Bld 93 70 - 99 mg/dL   BUN 9 6 - 23 mg/dL   Creat 1.610.65 0.960.50 - 0.451.10 mg/dL   Total Bilirubin 0.5 0.2 - 1.2 mg/dL   Alkaline Phosphatase 36 (L) 39 - 117 U/L   AST 10 0 - 37 U/L   ALT <8 0 - 35 U/L   Total Protein 7.3 6.0 - 8.3 g/dL   Albumin 4.7 3.5 - 5.2 g/dL   Calcium 9.9 8.4 - 40.910.5 mg/dL  POCT CBC  Result Value Ref Range   WBC 5.8 4.6 - 10.2 K/uL   Lymph, poc 2.7 0.6 - 3.4   POC LYMPH PERCENT 47.1 10 - 50 %L   MID (cbc) 0.2 0 - 0.9   POC MID % 3.6 0 - 12 %M   POC Granulocyte 2.9 2 - 6.9   Granulocyte percent 49.3 37 - 80 %G   RBC 5.52 (A) 4.04 - 5.48 M/uL   Hemoglobin 10.5 (A) 12.2 - 16.2 g/dL   HCT, POC 81.135.1 (A) 91.437.7 - 47.9 %   MCV 63.5 (A) 80 - 97 fL   MCH, POC 19.0 (A) 27 - 31.2 pg   MCHC 29.9 (A) 31.8 - 35.4 g/dL   RDW, POC 78.215.0 %   Platelet Count, POC 495 (A) 142 - 424 K/uL   MPV 10.8 0 - 99.8 fL  POCT urinalysis dipstick  Result Value Ref Range   Color, UA yellow    Clarity, UA clear    Glucose, UA neg    Bilirubin, UA neg    Ketones, UA 15    Spec Grav, UA 1.015    Blood, UA large    pH, UA 8.5    Protein, UA trace    Urobilinogen, UA 0.2    Nitrite, UA neg    Leukocytes, UA Trace   POCT urine pregnancy  Result Value Ref Range   Preg Test, Ur Negative   POCT UA - Microscopic Only  Result Value Ref Range   WBC, Ur, HPF, POC 2-4    RBC, urine, microscopic 3-5    Bacteria, U Microscopic trace    Mucus, UA neg    Epithelial cells, urine per micros 8-10    Crystals, Ur, HPF, POC neg    Casts, Ur, LPF, POC neg    Yeast, UA neg

## 2015-08-04 ENCOUNTER — Emergency Department (HOSPITAL_COMMUNITY)
Admission: EM | Admit: 2015-08-04 | Discharge: 2015-08-04 | Disposition: A | Discharge status: Home / Self Care | Payer: Managed Care, Other (non HMO) | Attending: Emergency Medicine | Admitting: Emergency Medicine

## 2015-08-04 ENCOUNTER — Encounter (HOSPITAL_COMMUNITY): Payer: Self-pay | Admitting: Emergency Medicine

## 2015-08-04 DIAGNOSIS — D649 Anemia, unspecified: Secondary | ICD-10-CM | POA: Insufficient documentation

## 2015-08-04 DIAGNOSIS — R112 Nausea with vomiting, unspecified: Secondary | ICD-10-CM | POA: Insufficient documentation

## 2015-08-04 DIAGNOSIS — R109 Unspecified abdominal pain: Secondary | ICD-10-CM | POA: Insufficient documentation

## 2015-08-04 DIAGNOSIS — R000 Tachycardia, unspecified: Secondary | ICD-10-CM | POA: Insufficient documentation

## 2015-08-04 DIAGNOSIS — F419 Anxiety disorder, unspecified: Secondary | ICD-10-CM | POA: Insufficient documentation

## 2015-08-04 DIAGNOSIS — Z79899 Other long term (current) drug therapy: Secondary | ICD-10-CM | POA: Insufficient documentation

## 2015-08-04 MED ORDER — HYDROXYZINE HCL 25 MG PO TABS
25.0000 mg | ORAL_TABLET | Freq: Once | ORAL | Status: AC
  Administered 2015-08-04: 25 mg via ORAL
  Filled 2015-08-04: qty 1

## 2015-08-04 MED ORDER — ONDANSETRON 4 MG PO TBDP
4.0000 mg | ORAL_TABLET | Freq: Once | ORAL | Status: AC
  Administered 2015-08-04: 4 mg via ORAL
  Filled 2015-08-04: qty 1

## 2015-08-04 MED ORDER — HYDROXYZINE HCL 25 MG PO TABS: 25.0000 mg | ORAL_TABLET | Freq: Four times a day (QID) | ORAL | Status: DC | PRN

## 2015-08-04 MED ORDER — ACETAMINOPHEN 325 MG PO TABS
650.0000 mg | ORAL_TABLET | Freq: Once | ORAL | Status: AC
  Administered 2015-08-04: 650 mg via ORAL
  Filled 2015-08-04: qty 2

## 2015-08-04 NOTE — Discharge Instructions (Signed)
1. Medications: Hydroxyzine, usual home medications 2. Treatment: rest, drink plenty of fluids,  3. Follow Up: Please followup with your primary doctor in 7 days for discussion of your diagnoses and further evaluation after today's visit; if you do not have a primary care doctor use the resource guide provided to find one; Please return to the ER for worsening symptoms    Panic Attacks Panic attacks are sudden, short-livedsurges of severe anxiety, fear, or discomfort. They may occur for no reason when you are relaxed, when you are anxious, or when you are sleeping. Panic attacks may occur for a number of reasons:   Healthy people occasionally have panic attacks in extreme, life-threatening situations, such as war or natural disasters. Normal anxiety is a protective mechanism of the body that helps [place] react to danger (fight or flight response).  Panic attacks are often seen with anxiety disorders, such as panic disorder, social anxiety disorder, generalized anxiety disorder, and phobias. Anxiety disorders cause excessive or uncontrollable anxiety. They may interfere with your relationships or other life activities.  Panic attacks are sometimes seen with other mental illnesses, such as depression and posttraumatic stress disorder.  Certain medical conditions, prescription medicines, and drugs of abuse can cause panic attacks. SYMPTOMS  Panic attacks start suddenly, peak within 20 minutes, and are accompanied by four or more of the following symptoms:  Pounding heart or fast heart rate (palpitations).  Sweating.  Trembling or shaking.  Shortness of breath or feeling smothered.  Feeling choked.  Chest pain or discomfort.  Nausea or strange feeling in your stomach.  Dizziness, light-headedness, or feeling like you will faint.  Chills or hot flushes.  Numbness or tingling in your lips or hands and feet.  Feeling that things are not real or feeling that you are not yourself.  Fear  of losing control or going crazy.  Fear of dying. Some of these symptoms can mimic serious medical conditions. For example, you may think you are having a heart attack. Although panic attacks can be very scary, they are not life threatening. DIAGNOSIS  Panic attacks are diagnosed through an assessment by your health care provider. Your health care provider will ask questions about your symptoms, such as where and when they occurred. Your health care provider will also ask about your medical history and use of alcohol and drugs, including prescription medicines. Your health care provider may order blood tests or other studies to rule out a serious medical condition. Your health care provider may refer you to a mental health professional for further evaluation. TREATMENT   Most healthy people who have one or two panic attacks in an extreme, life-threatening situation will not require treatment.  The treatment for panic attacks associated with anxiety disorders or other mental illness typically involves counseling with a mental health professional, medicine, or a combination of both. Your health care provider will help determine what treatment is best for you.  Panic attacks due to physical illness usually go away with treatment of the illness. If prescription medicine is causing panic attacks, talk with your health care provider about stopping the medicine, decreasing the dose, or substituting another medicine.  Panic attacks due to alcohol or drug abuse go away with abstinence. Some adults need professional help in order to stop drinking or using drugs. HOME CARE INSTRUCTIONS   Take all medicines as directed by your health care provider.   Schedule and attend follow-up visits as directed by your health care provider. It is important to keep  all your appointments. SEEK MEDICAL CARE IF:  You are not able to take your medicines as prescribed.  Your symptoms do not improve or get worse. SEEK  IMMEDIATE MEDICAL CARE IF:   You experience panic attack symptoms that are different than your usual symptoms.  You have serious thoughts about hurting yourself or others.  You are taking medicine for panic attacks and have a serious side effect. MAKE SURE YOU:  Understand these instructions.  Will watch your condition.  Will get help right away if you are not doing well or get worse. Document Released: 12/03/2005 Document Revised: 12/08/2013 Document Reviewed: 07/17/2013 [facility] Patient Information 2015 [place], [place]. This information is not intended to replace advice given to you by your health care provider. Make sure you discuss any questions you have with your health care provider.    Emergency Department Resource Guide 1) Find a Doctor and Pay Out of Pocket Although you won't have to find out who is covered by your insurance plan, it is a good idea to ask around and get recommendations. You will then need to call the office and see if the doctor you have chosen will accept you as a new patient and what types of options they offer for patients who are self-pay. Some doctors offer discounts or will set up payment plans for their patients who do not have insurance, but you will need to ask so you aren't surprised when you get to your appointment.  2) Contact Your Local Health Department Not all health departments have doctors that can see patients for sick visits, but many do, so it is worth a call to see if yours does. If you don't know where your local health department is, you can check in your phone book. The CDC also has a tool to help you locate your state's health department, and many state websites also have listings of all of their local health departments.  3) Find a Walk-in Clinic If your illness is not likely to be very severe or complicated, you may want to try a walk in clinic. These are popping up all over the country in pharmacies, drugstores, and shopping centers.  They're usually staffed by nurse practitioners or physician assistants that have been trained to treat common illnesses and complaints. They're usually fairly quick and inexpensive. However, if you have serious medical issues or chronic medical problems, these are probably not your best option.  No Primary Care Doctor: - Call Health Connect at  [phone] - they can help you locate a primary care doctor that  accepts your insurance, provides certain services, etc. - Physician Referral Service- [phone]  Chronic Pain Problems: Organization         Address  Phone   Notes  [facility]  [phone] Patients need to be referred by their primary care doctor.   Medication Assistance: Organization         Address  Phone   Notes  [facility] Medication [facility] [address] [place] [place], [place] [address] [phone] --Must be a resident of [facility] -- Must have NO insurance coverage whatsoever (no Medicaid/ Medicare, etc.) -- The pt. MUST have a primary care doctor that directs their care regularly and follows them in the community   MedAssist  [phone]   [employer]  [phone]    Agencies that provide inexpensive medical care: Retail buyer  Notes  [place]  [phone]   [facility]    [phone]   [facility] [place], [place] [address] [phone]   [place] [address], [place] [phone]   Planned Parenthood    [phone]   [facility]    [phone]   [place] [address], [place] Phone:  [phone], Fax:  [phone] Hours of Operation:  9 am - 6 pm, M-F.  Also accepts Medicaid/Medicare and self-pay.  [facility] for [place], [address], [place] Phone: [phone], Fax: [phone]. Hours of Operation:  8:30 am - 5:30 pm, M-F.  Also accepts Medicaid and self-pay.  [facility] [place] [address] [place] Phone: [phone]   [place], [place], [place] [phone], Ext. [phone] Mondays & Thursdays: 7-9 AM.  First 15 patients are seen on a first come, first serve basis.    [place] Providers:  Organization         Address  Phone   Notes  [facility] [address], [place] [phone] Also accepts self-pay patients.  [facility] [address], [place]  [phone]   [place], [address], [place] [phone]   [facility] [address], [place] [phone]   Lucianne Lei [address], [place]   [phone] Only accepts [place] Access [place] patients after they have their name applied to their card.   Self-Pay (no insurance) in [facility]:  Organization         Address  Phone   Notes  Sickle Cell Patients, [facility] [place] [phone]   [facility] [place] [phone]   [facility] [place]  [place], [place], [place] [phone]   [facility]/Dr. Osei-Bonsu  [address] [place] or [address], [place] [phone] Phone number for both [place] and [place] locations is the same.  [facility] [address] [place] [phone]   [facility] [address] [place] or [address] [phone]   [facility] [address], [place] [phone], phone; [phone], fax Sees patients 1st and 3rd Saturday of every month.  Must not qualify for public or private insurance (i.e.  Medicaid, Medicare, [place] Health Choice, Veterans' Benefits)  Household income should be no more than 200% of the poverty level The clinic cannot treat you if you are pregnant or think you are pregnant  Sexually transmitted diseases are not treated at the clinic.    Dental Care: Organization         Address  Phone  Notes  [facility] Department of [facility] [phone] Accepts children up to age 13 who are enrolled in [place] or [place]; pregnant women with a Medicaid card; and children who have applied for Medicaid or [place] Health Choice, but were declined, whose parents can pay a reduced fee at time of service.  [facility]  Department of [facility]  [address], [place] [phone] Accepts children up to age 27 who are enrolled in [place] or [place]; pregnant women with a Medicaid card; and children who have applied for Medicaid or  Health Choice, but were declined, whose parents can pay a reduced fee at time of service.  [place] Adult [facility] Access PRO[facility]  [place] [phone] Patients are seen by appointment only. Walk-ins are not accepted. [place] will see patients 68 years of age and older. Monday - Tuesday (8am-5pm) Most Wednesdays (8:30-5pm) $30 per visit, cash only  [facility] Adult Dental Access PROGRAM  [address], [facility] [phone] Patients are seen by appointment only. Walk-ins are not accepted. [place] will see patients 56 years of age and older. One Wednesday Evening (Monthly: Volunteer Based).  $30 per visit, cash only  [place]  [phone] for adults; Children under age 37, call Graduate Pediatric Dentistry at [phone]. Children aged 29-14, please call [phone] to request a pediatric application.  Dental services are provided in all areas of dental care including fillings,  crowns and bridges, complete and partial dentures, implants, gum treatment, root canals, and extractions. Preventive care is also provided. Treatment is provided to both adults and children. Patients are selected via a lottery and there is often a waiting list.   [facility] [address], [place]  [phone] [email or web address]   [facility] [address] [place], [place] [phone], Ext. [phone] Second and Fourth Thursday of each month, opens at 6:30 AM; Clinic ends at 9 AM.  Patients are seen on a first-come first-served basis, and a limited number are seen during each clinic.   [facility]  [address] Hillard Danker [place], [place] [phone]   Eligibility Requirements You must have lived in [place], [place], or [place] counties for at least the last three months.   You cannot be eligible for state or federal sponsored [employer], including [employer], [place], or [employer].   You generally cannot be eligible for healthcare insurance through your employer.    How to apply: Eligibility screenings are held every Tuesday and Wednesday afternoon from 1:00 pm until 4:00 pm. You do not need an appointment for the interview!  [facility] [address], [place], [place]   [place]  [place] Department  [place]  [phone]    Behavioral Health Resources in the Community: Intensive Outpatient Programs Organization         Address  Phone  Notes  [place]. [address] [place], [place] [phone]   [facility] Outpatient [address] [place], [place]   ADS: [facility] Drug [facility] [address] [place], [place]   [place] [address]  [place], [place] or [phone]   Substance Abuse  Resources Organization         Address  Phone  Notes  Alcohol and Drug Services  [phone]   [place]  [phone]   [facility] [place]   Chinita Pester  [phone]   Residential & Outpatient Substance Abuse Program  2046219048   Psychological Services Organization         Address  Phone  Notes  [facility]  [phone]   [facility]  [phone]   [facility] Mental Health [address] [place] [phone] or [phone]    Mobile Crisis Teams Organization         Address  Phone  Notes  [facility], Mobile Crisis Care Unit  [phone]   [facility]  [address] [place], [place] [phone]   Doristine Locks [address] [place] [phone]    Self-Help/Support Groups Organization         Address  Phone             Notes  [facility] [place] - variety of support groups  336- I7437963 Call for more information  Narcotics Anonymous (NA), Caring [facility] [address], [employer] [place]  2 meetings at this location   Statistician         Address  Phone  Notes  [facility] [address],    [place]  [phone]   [facility]  [address] [place] [address], [place], [place] [phone]   [facility] [address] [place], [place] [phone] Admissions: 8am-3pm M-F  [facility] [address]    [place], [place] [phone]   [facility] [address] [place], [place], [place] [phone]   [facility] [address]  [place], [place] [phone]   [facility] - Intensive Outpatient [address], [place], [place] [phone]   [facility] (Addiction [facility] [address] [place].,  [place], [place] [phone] or [phone]   [facility] Services (RTS) [address], [place], [place] [phone] Accepts Medicaid  [facility] [place] [address],  [place] [phone] Substance Abuse/Addiction Treatment   [facility] Organization         Address  Phone  Notes  [facility]  [phone]   Angie Fava, PhD [address] [place], [place]   [phone] or 7164351932   [facility] Behavioral   [address] [place], [place] [phone]   [facility] [address], [place], [place] [phone] Insurance/Medicaid/sponsorship through [facility] and Families [address]                                    [place], [place] [phone] Therapy/tele-psych/case  [facility] [address], [place] [phone]    Dr. Lolly Mustache  [phone]   [facility]  [facility] Dept. 1) [address] [place] 2) [address] [place] 3)  [address], [place] [phone]  [phone]   [facility] Child Abuse Hotline [phone] or [phone] (After Hours)

## 2015-08-04 NOTE — ED Provider Notes (Signed)
Care assumed from Clearence Ped, NP.  Adrienne Hawkins is a 19 y.o. female presents with anxiety attack after finding out that she was not accepted for admission for graduate school.  Pt has had multiple hours of crying since that time and developed a headache, abd pain and nausea.  She did have several episodes of vomiting.  Pt with a hx of anxiety but has never been treated for this.      Physical Exam  BP 141/96 mmHg  Pulse 111  Temp(Src) 98.8 F (37.1 C) (Oral)  Resp 30  SpO2 100%  LMP 06/17/2015  Physical Exam   Face to face Exam:   General: Awake  HEENT: Atraumatic  Resp: Normal effort, clear and equal breath sounds Cardiac: RRR  Abd: Nondistended  Neuro:No focal weakness      ED Course  Procedures  1. Acute anxiety    MDM  Plan: Here with a family friend who reports she has had previous episodes.  D/c when she is awake.    6:54 AM Pt is awake and reports feeling well.  She requests discharge home.  Tachycardia and tachypnea have resolved.    BP 109/63 mmHg  Pulse 65  Temp(Src) 98.3 F (36.8 C) (Oral)  Resp 16  SpO2 100%  LMP 06/17/2015    Dierdre Forth, PA-C 08/04/15 1610  Loren Racer, MD 08/04/15 [phone]

## 2015-08-04 NOTE — ED Notes (Signed)
Pt c/o HA, N/V/D, reports emesis x3 episodes in 24 hours, diarrhea x3 episodes in 24 hours. Also c/o right sided abdominal pain.

## 2015-08-04 NOTE — ED Provider Notes (Signed)
CSN: [id number]     Arrival date & time 08/04/15  0234 History   First MD Initiated Contact with Patient 08/04/15 0301     No chief complaint on file.    (Consider location/radiation/quality/duration/timing/severity/associated sxs/prior Treatment) HPI Comments: Father accompanies this patient to the emergency department.  He states that at 3:00 this afternoon.  She opened a letter from [facility] and was notified that she was not accepted for admission.  She became very upset and started crying.  She's been crying since then she has then developed a headache, abdominal pain, nausea.  She's vomited several times during crying episodes.  She states at 6:00 last night she took in taking the equivalent of a ibuprofen  The history is provided by the patient and a parent.    Past Medical History  Diagnosis Date  . Sickle cell anemia     doesnt really know  . Anemia    History reviewed. No pertinent past surgical history. No family history on file. Social History  Substance Use Topics  . Smoking status: Never Smoker   . Smokeless tobacco: None  . Alcohol Use: No   OB History    No data available     Review of Systems  Constitutional: Negative for fever.  Respiratory: Negative for cough and shortness of breath.   Cardiovascular: Negative for chest pain.  Gastrointestinal: Positive for nausea, vomiting and abdominal pain.  Psychiatric/Behavioral: The patient is nervous/anxious.   All other systems reviewed and are negative.     Allergies  Review of patient's allergies indicates no known allergies.  Home Medications   Prior to Admission medications   Medication Sig Start Date End Date Taking? Authorizing Provider  folic acid (FOLVITE) 1 MG tablet Take 1 mg by mouth daily.   Yes Historical Provider, MD  OVER THE COUNTER MEDICATION Take 1,000 mg by mouth every 4 (four) hours as needed (pain). Profene 1000mg    Yes Historical Provider, MD  ferrous sulfate 325 (65 FE)  MG tablet Take 1 tablet (325 mg total) by mouth daily with breakfast. Patient not taking: Reported on 08/04/2015 03/09/15   Pearline Cables, MD  hydrOXYzine (ATARAX/VISTARIL) 25 MG tablet Take 1 tablet (25 mg total) by mouth every 6 (six) hours as needed. 08/04/15   Earley Favor, NP  levonorgestrel-ethinyl estradiol (AVIANE) 0.1-20 MG-MCG tablet Take 1 tablet by mouth daily. Patient not taking: Reported on 08/04/2015 02/01/15   Morrell Riddle, PA-C  naproxen sodium (ANAPROX DS) 550 MG tablet Take 1 tablet (550 mg total) by mouth 2 (two) times daily with a meal. Patient not taking: Reported on 08/04/2015 02/01/15 02/01/16  [name], MD   BP 141/96 mmHg  Pulse 111  Temp(Src) 98.8 F (37.1 C) (Oral)  Resp 30  SpO2 100%  LMP 06/17/2015 Physical Exam  Constitutional: She is oriented to person, place, and time. She appears well-developed and well-nourished.  HENT:  Head: Normocephalic.  Eyes: Pupils are equal, round, and reactive to light.  Neck: Normal range of motion.  Cardiovascular: Regular rhythm.  Tachycardia present.   Pulmonary/Chest: Effort normal.  Musculoskeletal: Normal range of motion.  Neurological: She is alert and oriented to person, place, and time.  Skin: Skin is warm.  Nursing note and vitals reviewed.   ED Course  Procedures (including critical care time) Labs Review Labs Reviewed - No data to display  Imaging Review No results found. I have personally reviewed and evaluated these images and lab results as part of  my medical decision-making.   EKG Interpretation None     patient is much, she is able to rest at this point.  Family member, her brother and states that she has a history of anxiety and becomes upset easily, but has never been treated with any medication for her anxiety  MDM   Final diagnoses:  Acute anxiety         Earley Favor, NP 08/04/15 1610  Loren Racer, MD 08/04/15 [phone]

## 2015-10-12 ENCOUNTER — Ambulatory Visit (INDEPENDENT_AMBULATORY_CARE_PROVIDER_SITE_OTHER): Payer: PPO | Admitting: Emergency Medicine

## 2015-10-12 VITALS — BP 108/70 | HR 109 | Temp 98.4°F | Resp 18 | Ht 61.0 in | Wt 110.0 lb

## 2015-10-12 DIAGNOSIS — H2000 Unspecified acute and subacute iridocyclitis: Secondary | ICD-10-CM | POA: Diagnosis not present

## 2015-10-12 NOTE — Progress Notes (Signed)
Subjective:  Patient ID: Adrienne Hawkins, female    DOB: [birth year]  Age: 19 y.o. MRN: [id number]  CC: Blurred Vision; Conjunctivitis; and Eye Pain   HPI Adrienne Hawkins presents  with sudden onset of pain in her right eye that started yesterday. She has no history of injury or foreign body. She has marked photophobia. She has tearing. Her eye is red. 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 She said that her vision is blurry in that eye. No history of antecedent illness. She hasn't used any drops or manipulation. She doesn't wear contacts or glasses. She is studying here and is from [place]  History [name] has a past medical history of Sickle cell anemia (HCC) and Anemia.   She has no past surgical history on file.   Her  family history is not on file.  She   reports that she has never smoked. She does not have any smokeless tobacco history on file. She reports that she does not drink alcohol. Her drug history is not on file.  Outpatient Prescriptions Prior to Visit  Medication Sig Dispense Refill  . ferrous sulfate 325 (65 FE) MG tablet Take 1 tablet (325 mg total) by mouth daily with breakfast. 30 tablet 1  . folic acid (FOLVITE) 1 MG tablet Take 1 mg by mouth daily.    . hydrOXYzine (ATARAX/VISTARIL) 25 MG tablet Take 1 tablet (25 mg total) by mouth every 6 (six) hours as needed. (Patient not taking: Reported on 10/12/2015) 20 tablet 0  . levonorgestrel-ethinyl estradiol (AVIANE) 0.1-20 MG-MCG tablet Take 1 tablet by mouth daily. (Patient not taking: Reported on 08/04/2015) 1 Package 2  . naproxen sodium (ANAPROX DS) 550 MG tablet Take 1 tablet (550 mg total) by mouth 2 (two) times daily with a meal. (Patient not taking: Reported on 08/04/2015) 40 tablet 0  . OVER THE COUNTER MEDICATION Take 1,000 mg by mouth every 4 (four) hours as needed (pain). Profene 1000mg      No facility-administered medications prior to visit.    Social History   Social  History  . Marital Status: Single    Spouse Name: N/A  . Number of Children: N/A  . Years of Education: N/A   Social History Main Topics  . Smoking status: Never Smoker   . Smokeless tobacco: None  . Alcohol Use: No  . Drug Use: None  . Sexual Activity: Not Asked   Other Topics Concern  . None   Social History Narrative     Review of Systems  Constitutional: Negative for fever, chills and appetite change.  HENT: Negative for congestion, ear pain, postnasal drip, sinus pressure and sore throat.   Eyes: Positive for photophobia, pain, redness and visual disturbance.  Respiratory: Negative for cough, shortness of breath and wheezing.   Cardiovascular: Negative for leg swelling.  Gastrointestinal: Negative for nausea, vomiting, abdominal pain, diarrhea, constipation and blood in stool.  Endocrine: Negative for polyuria.  Genitourinary: Negative for dysuria, urgency, frequency and flank pain.  Musculoskeletal: Negative for gait problem.  Skin: Negative for rash.  Neurological: Negative for weakness and headaches.  Psychiatric/Behavioral: Negative for confusion and decreased concentration. The patient is not nervous/anxious.     Objective:  BP 108/70 mmHg  Pulse 109  Temp(Src) 98.4 F (36.9 C) (Oral)  Resp 18  Ht 5\' 1"  (1.549 m)  Wt 110 lb (49.896 kg)  BMI 20.80 kg/m2  SpO2 99%  LMP 07/27/2015  Physical Exam  Constitutional: She is  oriented to person, place, and time. She appears well-developed and well-nourished.  HENT:  Head: Normocephalic and atraumatic.  Eyes: Pupils are equal, round, and reactive to light. Right conjunctiva is injected. Right conjunctiva has no hemorrhage. Pupils are equal.  Pulmonary/Chest: Effort normal.  Musculoskeletal: She exhibits no edema.  Neurological: She is alert and oriented to person, place, and time.  Skin: Skin is dry.  Psychiatric: She has a normal mood and affect. Her behavior is normal. Thought content normal.    No pain  relief with ophthalmic drop had negative fluorescein uptake  Assessment & Plan:   There are no diagnoses linked to this encounter. I am having [name] maintain her folic acid, levonorgestrel-ethinyl estradiol, naproxen sodium, ferrous sulfate, OVER THE COUNTER MEDICATION, and hydrOXYzine.  No orders of the defined types were placed in this encounter.     She was referred to Dr. Alden Hipp and will see him directly  Appropriate red flag conditions were discussed with the patient as well as actions that should be taken.  Patient expressed his understanding.  Follow-up: No Follow-up on file.  [name], MD

## 2015-10-24 ENCOUNTER — Ambulatory Visit (INDEPENDENT_AMBULATORY_CARE_PROVIDER_SITE_OTHER): Payer: PPO

## 2015-10-24 ENCOUNTER — Ambulatory Visit (INDEPENDENT_AMBULATORY_CARE_PROVIDER_SITE_OTHER): Payer: PPO | Admitting: Family Medicine

## 2015-10-24 VITALS — BP 102/70 | HR 90 | Temp 98.3°F | Resp 16 | Ht 61.0 in | Wt 109.4 lb

## 2015-10-24 DIAGNOSIS — Z23 Encounter for immunization: Secondary | ICD-10-CM | POA: Diagnosis not present

## 2015-10-24 DIAGNOSIS — R109 Unspecified abdominal pain: Secondary | ICD-10-CM | POA: Diagnosis not present

## 2015-10-24 DIAGNOSIS — D649 Anemia, unspecified: Secondary | ICD-10-CM

## 2015-10-24 DIAGNOSIS — K5901 Slow transit constipation: Secondary | ICD-10-CM

## 2015-10-24 LAB — POCT CBC
GRANULOCYTE PERCENT: 46.9 % (ref 37–80)
HEMATOCRIT: 33.3 % — AB (ref 37.7–47.9)
HEMOGLOBIN: 10.6 g/dL — AB (ref 12.2–16.2)
LYMPH, POC: 2.4 (ref 0.6–3.4)
MCH, POC: 19.7 pg — AB (ref 27–31.2)
MCHC: 31.8 g/dL (ref 31.8–35.4)
MCV: 61.9 fL — AB (ref 80–97)
MID (cbc): 0.4 (ref 0–0.9)
MPV: 10.2 fL (ref 0–99.8)
POC GRANULOCYTE: 2.5 (ref 2–6.9)
POC LYMPH %: 46.2 % (ref 10–50)
POC MID %: 6.9 % (ref 0–12)
Platelet Count, POC: 487 10*3/uL — AB (ref 142–424)
RBC: 5.37 M/uL (ref 4.04–5.48)
RDW, POC: 15 %
WBC: 5.3 10*3/uL (ref 4.6–10.2)

## 2015-10-24 LAB — COMPLETE METABOLIC PANEL WITH GFR
ALBUMIN: 4.3 g/dL (ref 3.6–5.1)
ALK PHOS: 41 U/L (ref 33–115)
ALT: 5 U/L — ABNORMAL LOW (ref 6–29)
AST: 9 U/L — AB (ref 10–30)
BUN: 12 mg/dL (ref 7–25)
CO2: 24 mmol/L (ref 20–31)
Calcium: 8.9 mg/dL (ref 8.6–10.2)
Chloride: 104 mmol/L (ref 98–110)
Creat: 0.66 mg/dL (ref 0.50–1.10)
GFR, Est African American: >89 mL/min (ref >=60)
GFR, Est Non African American: >89 mL/min (ref >=60)
GLUCOSE: 88 mg/dL (ref 65–99)
POTASSIUM: 4 mmol/L (ref 3.5–5.3)
SODIUM: 137 mmol/L (ref 135–146)
TOTAL PROTEIN: 7.4 g/dL (ref 6.1–8.1)
Total Bilirubin: 0.7 mg/dL (ref 0.2–1.2)

## 2015-10-24 LAB — POCT URINE PREGNANCY: Preg Test, Ur: NEGATIVE

## 2015-10-24 LAB — POC MICROSCOPIC URINALYSIS (UMFC): Mucus: ABSENT

## 2015-10-24 NOTE — Progress Notes (Addendum)
Patient ID: Adrienne Hawkins, female    DOB: August 26, 1996  Age: 19 y.o. MRN: [id number]  Chief Complaint  Patient presents with  . Abdominal Pain    since yesterday  . Fatigue  . Flu Vaccine    Subjective:   19 year old student from [place].  2 months of right abdominal and lateral abdominal pain.  No NVD.  Bowels normal.  No URI. No fever.  No dysuria.  Motion hurts.  Is having a lot of stress as she is trying to move to [employer] and change majors.  LMP current.  Not sexually involved.  On iron and folate  Current allergies, medications, problem list, past/family and social histories reviewed.  Objective:  BP 102/70 mmHg  Pulse 90  Temp(Src) 98.3 F (36.8 C) (Oral)  Resp 16  Ht  (1.549 m)  Wt 109 lb 6.4 oz (49.624 kg)  BMI 20.68 kg/m2  SpO2 98%  LMP 10/23/2015  CTA.  Abd normal bowel sounds.  Soft.  Tender far right abdomen lateral to McBurney's point.  MIld CVA tenderness on right.  Pain with straight leg raising in lateral abdominal muscles [facility] reading (PRIMARY) by  Dr. Alwyn Ren Nonspecific gas/stool pattern.  ? constipation.    Assessment & Plan:   Assessment: 1. Right lateral abdominal pain   2. Slow transit constipation   3. Anemia, unspecified anemia type       Plan: Check labs Results for orders placed or performed in visit on 10/24/15  POCT CBC  Result Value Ref Range   WBC 5.3 4.6 - 10.2 K/uL   Lymph, poc 2.4 0.6 - 3.4   POC LYMPH PERCENT 46.2 10 - 50 %L   MID (cbc) 0.4 0 - 0.9   POC MID % 6.9 0 - 12 %M   POC Granulocyte 2.5 2 - 6.9   Granulocyte percent 46.9 37 - 80 %G   RBC 5.37 4.04 - 5.48 M/uL   Hemoglobin 10.6 (A) 12.2 - 16.2 g/dL   HCT, POC 40.9 (A) 81.1 - 47.9 %   MCV 61.9 (A) 80 - 97 fL   MCH, POC 19.7 (A) 27 - 31.2 pg   MCHC 31.8 31.8 - 35.4 g/dL   RDW, POC 91.4 %   Platelet Count, POC 487 (A) 142 - 424 K/uL   MPV 10.2 0 - 99.8 fL  POCT urine pregnancy  Result Value Ref Range   Preg Test, Ur Negative Negative  POCT Microscopic  Urinalysis ([facility])  Result Value Ref Range   WBC,UR,HPF,POC Few (A) None WBC/hpf   RBC,UR,HPF,POC Many (A) None RBC/hpf   Bacteria None None, Too numerous to count   Mucus Absent Absent   Epithelial Cells, UR Per Microscopy Few (A) None, Too numerous to count cells/hpf    Orders Placed This Encounter  Procedures  . DG Abd 2 Views    Standing Status: Future     Number of Occurrences: 1     Standing Expiration Date: 10/23/2016    Order Specific Question:  Reason for Exam (SYMPTOM  OR DIAGNOSIS REQUIRED)    Answer:  rlq pain; on menses    Order Specific Question:  Is the patient pregnant?    Answer:  No    Order Specific Question:  Preferred imaging location?    Answer:  External  . COMPLETE METABOLIC PANEL WITH GFR  . POCT CBC  . POCT urine pregnancy  . POCT Microscopic Urinalysis ([facility])    No orders of the defined types were  placed in this encounter.         Patient Instructions  Drink lots of fluids  Take Miralax one or two doses today, then once daily until stools are loose.  Then decrease and use only when needed for pain or if you need a good bowel movement.  19 Constipation, Adult Constipation is when a person has fewer than three bowel movements a week, has difficulty having a bowel movement, or has stools that are dry, hard, or larger than normal. As people grow older, constipation is more common. A low-fiber diet, not taking in enough fluids, and taking certain medicines may make constipation worse.  CAUSES   Certain medicines, such as antidepressants, pain medicine, iron supplements, antacids, and water pills.   Certain diseases, such as diabetes, irritable bowel syndrome (IBS), thyroid disease, or depression.   Not drinking enough water.   Not eating enough fiber-rich foods.   Stress or travel.   Lack of physical activity or exercise.   Ignoring the urge to have a bowel movement.   Using laxatives too much.  SIGNS AND SYMPTOMS   Having fewer  than three bowel movements a week.   Straining to have a bowel movement.   Having stools that are hard, dry, or larger than normal.   Feeling full or bloated.   Pain in the lower abdomen.   Not feeling relief after having a bowel movement.  DIAGNOSIS  Your health care provider will take a medical history and perform a physical exam. Further testing may be done for severe constipation. Some tests may include:  A barium enema X-ray to examine your rectum, colon, and, sometimes, your small intestine.   A sigmoidoscopy to examine your lower colon.   A colonoscopy to examine your entire colon. TREATMENT  Treatment will depend on the severity of your constipation and what is causing it. Some dietary treatments include drinking more fluids and eating more fiber-rich foods. Lifestyle treatments may include regular exercise. If these diet and lifestyle recommendations do not help, your health care provider may recommend taking over-the-counter laxative medicines to help you have bowel movements. Prescription medicines may be prescribed if over-the-counter medicines do not work.  HOME CARE INSTRUCTIONS   Eat foods that have a lot of fiber, such as fruits, vegetables, whole grains, and beans.  Limit foods high in fat and processed sugars, such as french fries, hamburgers, cookies, candies, and soda.   A fiber supplement may be added to your diet if you cannot get enough fiber from foods.   Drink enough fluids to keep your urine clear or pale yellow.   Exercise regularly or as directed by your health care provider.   Go to the restroom when you have the urge to go. Do not hold it.   Only take over-the-counter or prescription medicines as directed by your health care provider. Do not take other medicines for constipation without talking to your health care provider first.  SEEK IMMEDIATE MEDICAL CARE IF:   You have bright red blood in your stool.   Your constipation lasts  for more than 4 days or gets worse.   You have abdominal or rectal pain.   You have thin, pencil-like stools.   You have unexplained weight loss. MAKE SURE YOU:   Understand these instructions.  Will watch your condition.  Will get help right away if you are not doing well or get worse.   This information is not intended to replace advice given to you by your health  care provider. Make sure you discuss any questions you have with your health care provider.   Document Released: 08/31/2004 Document Revised: 12/24/2014 Document Reviewed: 09/14/2013 Elsevier Interactive Patient Education [employer]2016 Elsevier Inc.      Return if symptoms worsen or fail to improve.   HOPPER,DAVID, MD 10/24/2015

## 2015-10-24 NOTE — Patient Instructions (Signed)
Drink lots of fluids  Take Miralax one or two doses today, then once daily until stools are loose.  Then decrease and use only when needed for pain or if you need a good bowel movement.  Constipation, Adult Constipation is when a person has fewer than three bowel movements a week, has difficulty having a bowel movement, or has stools that are dry, hard, or larger than normal. As people grow older, constipation is more common. A low-fiber diet, not taking in enough fluids, and taking certain medicines may make constipation worse.  CAUSES   Certain medicines, such as antidepressants, pain medicine, iron supplements, antacids, and water pills.   Certain diseases, such as diabetes, irritable bowel syndrome (IBS), thyroid disease, or depression.   Not drinking enough water.   Not eating enough fiber-rich foods.   Stress or travel.   Lack of physical activity or exercise.   Ignoring the urge to have a bowel movement.   Using laxatives too much.  SIGNS AND SYMPTOMS   Having fewer than three bowel movements a week.   Straining to have a bowel movement.   Having stools that are hard, dry, or larger than normal.   Feeling full or bloated.   Pain in the lower abdomen.   Not feeling relief after having a bowel movement.  DIAGNOSIS  Your health care provider will take a medical history and perform a physical exam. Further testing may be done for severe constipation. Some tests may include:  A barium enema X-ray to examine your rectum, colon, and, sometimes, your small intestine.   A sigmoidoscopy to examine your lower colon.   A colonoscopy to examine your entire colon. TREATMENT  Treatment will depend on the severity of your constipation and what is causing it. Some dietary treatments include drinking more fluids and eating more fiber-rich foods. Lifestyle treatments may include regular exercise. If these diet and lifestyle recommendations do not help, your health  care provider may recommend taking over-the-counter laxative medicines to help you have bowel movements. Prescription medicines may be prescribed if over-the-counter medicines do not work.  HOME CARE INSTRUCTIONS   Eat foods that have a lot of fiber, such as fruits, vegetables, whole grains, and beans.  Limit foods high in fat and processed sugars, such as french fries, hamburgers, cookies, candies, and soda.   A fiber supplement may be added to your diet if you cannot get enough fiber from foods.   Drink enough fluids to keep your urine clear or pale yellow.   Exercise regularly or as directed by your health care provider.   Go to the restroom when you have the urge to go. Do not hold it.   Only take over-the-counter or prescription medicines as directed by your health care provider. Do not take other medicines for constipation without talking to your health care provider first.  SEEK IMMEDIATE MEDICAL CARE IF:   You have bright red blood in your stool.   Your constipation lasts for more than 4 days or gets worse.   You have abdominal or rectal pain.   You have thin, pencil-like stools.   You have unexplained weight loss. MAKE SURE YOU:   Understand these instructions.  Will watch your condition.  Will get help right away if you are not doing well or get worse.   This information is not intended to replace advice given to you by your health care provider. Make sure you discuss any questions you have with your health care provider.  Document Released: 08/31/2004 Document Revised: 12/24/2014 Document Reviewed: 09/14/2013 Elsevier Interactive Patient Education [employer].

## 2015-10-24 NOTE — Addendum Note (Signed)
Addended by: Nita SellsSMITH, Gabriella Woodhead S on: 10/24/2015 11:23 AM   Modules accepted: Orders

## 2016-07-24 ENCOUNTER — Ambulatory Visit (INDEPENDENT_AMBULATORY_CARE_PROVIDER_SITE_OTHER): Payer: PPO | Admitting: Family Medicine

## 2016-07-24 VITALS — HR 76 | Temp 98.6°F | Resp 17 | Ht 61.0 in | Wt 114.0 lb

## 2016-07-24 DIAGNOSIS — R21 Rash and other nonspecific skin eruption: Secondary | ICD-10-CM | POA: Diagnosis not present

## 2016-07-24 MED ORDER — CLOTRIMAZOLE-BETAMETHASONE 1-0.05 % EX CREA: 1.0000 "application " | TOPICAL_CREAM | Freq: Two times a day (BID) | CUTANEOUS | 0 refills | Status: DC

## 2016-07-24 NOTE — Patient Instructions (Addendum)
Apply Lotrisone cream to rash twice daily. If rash worsens or does not improve within 7-10 days, return to office for follow-up.    IF you received an x-ray today, you will receive an invoice from [facility]. Please contact [facility] at [phone] with questions or concerns regarding your invoice.   IF you received labwork today, you will receive an invoice from [facility]. Please contact [facility] at [phone] with questions or concerns regarding your invoice.   Our billing staff will not be able to assist you with questions regarding bills from these companies.  You will be contacted with the lab results as soon as they are available. The fastest way to get your results is to activate your My Chart account. Instructions are located on the last page of this paperwork. If you have not heard from us regarding the results in 2 weeks, please contact this office.

## 2016-07-24 NOTE — Progress Notes (Signed)
Patient ID: Adrienne RichtersMariam Hawkins, female    DOB: 1996-11-17, 20 y.o.   MRN: [id number]  PCP: No PCP Per Patient  Chief Complaint  Patient presents with  . Mass    Left shoulder, x22months.     Subjective:   HPI Presents for evaluation of rash on the left shoulder times 2 months. Rash started as a small, circular, lesion over 2 months which has gradually  increased in size.  The lesion has become dry and itches.  She has not applied any medication to rash.  Denies any other rashes or skin eruptions on her body. No recent changes to detergents, perfumes, fabrics, or lotions.  Social History   Social History  . Marital status: Single    Spouse name: N/A  . Number of children: N/A  . Years of education: N/A   Occupational History  . Not on file.   Social History Main Topics  . Smoking status: Never Smoker  . Smokeless tobacco: Not on file  . Alcohol use No  . Drug use: Unknown  . Sexual activity: Not on file   Other Topics Concern  . Not on file   Social History Narrative  . No narrative on file   .No family history on file.  Review of Systems  Constitutional: Negative.   Respiratory: Negative.   Cardiovascular: Negative.   Gastrointestinal: Negative.   Musculoskeletal: Negative.   Skin: Positive for rash.       See HPI     Patient Active Problem List   Diagnosis Date Noted  . Reaction, adjustment, with anxious, depressed mood 06/29/2014     Prior to Admission medications   Medication Sig Start Date End Date Taking? Authorizing Provider  folic acid (FOLVITE) 1 MG tablet Take 1 mg by mouth daily.   Yes Historical Provider, MD  ibuprofen (ADVIL,MOTRIN) 200 MG tablet Take 200 mg by mouth every 6 (six) hours as needed.   Yes Historical Provider, MD  ferrous sulfate 325 (65 FE) MG tablet Take 1 tablet (325 mg total) by mouth daily with breakfast. Patient not taking: Reported on 07/24/2016 03/09/15   Pearline CablesJessica C Copland, MD  hydrOXYzine (ATARAX/VISTARIL) 25 MG tablet  Take 1 tablet (25 mg total) by mouth every 6 (six) hours as needed. Patient not taking: Reported on 10/12/2015 08/04/15   Earley FavorGail Schulz, NP  levonorgestrel-ethinyl estradiol (AVIANE) 0.1-20 MG-MCG tablet Take 1 tablet by mouth daily. Patient not taking: Reported on 08/04/2015 02/01/15   Morrell RiddleSarah L Weber, PA-C     No Known Allergies     Objective:  Physical Exam  Constitutional: She is oriented to person, place, and time. She appears well-developed and well-nourished.  HENT:  Head: Normocephalic and atraumatic.  Right Ear: External ear normal.  Left Ear: External ear normal.  Eyes: Pupils are equal, round, and reactive to light.  Neck: Normal range of motion. Neck supple.  Cardiovascular: Normal rate, regular rhythm, normal heart sounds and intact distal pulses.   Pulmonary/Chest: Effort normal and breath sounds normal.  Neurological: She is alert and oriented to person, place, and time.  Skin: Skin is warm and dry. Rash noted. There is erythema.  Circular localized rash proximal to the scapula. Flat macule, dry, and mild erythema present.  Psychiatric: She has a normal mood and affect. Her behavior is normal. Judgment and thought content normal.       Assessment & Plan:  1. Rash and nonspecific skin eruption . . clotrimazole-betamethasone (LOTRISONE) cream    Sig: Apply 1  application topically 2 (two) times daily.   Follow-up as needed or if rash worsens.  Godfrey Pick. Tiburcio Pea, MSN, FNP-C [facility]

## 2016-08-27 IMAGING — CR DG ABDOMEN 2V
2 series · 2 of 2 positions shown · non-contrast
Comparison: None.

CLINICAL DATA: Right upper quadrant and right lower quadrant pain

EXAM:
ABDOMEN - 2 VIEW

[AP (1 of 2)]
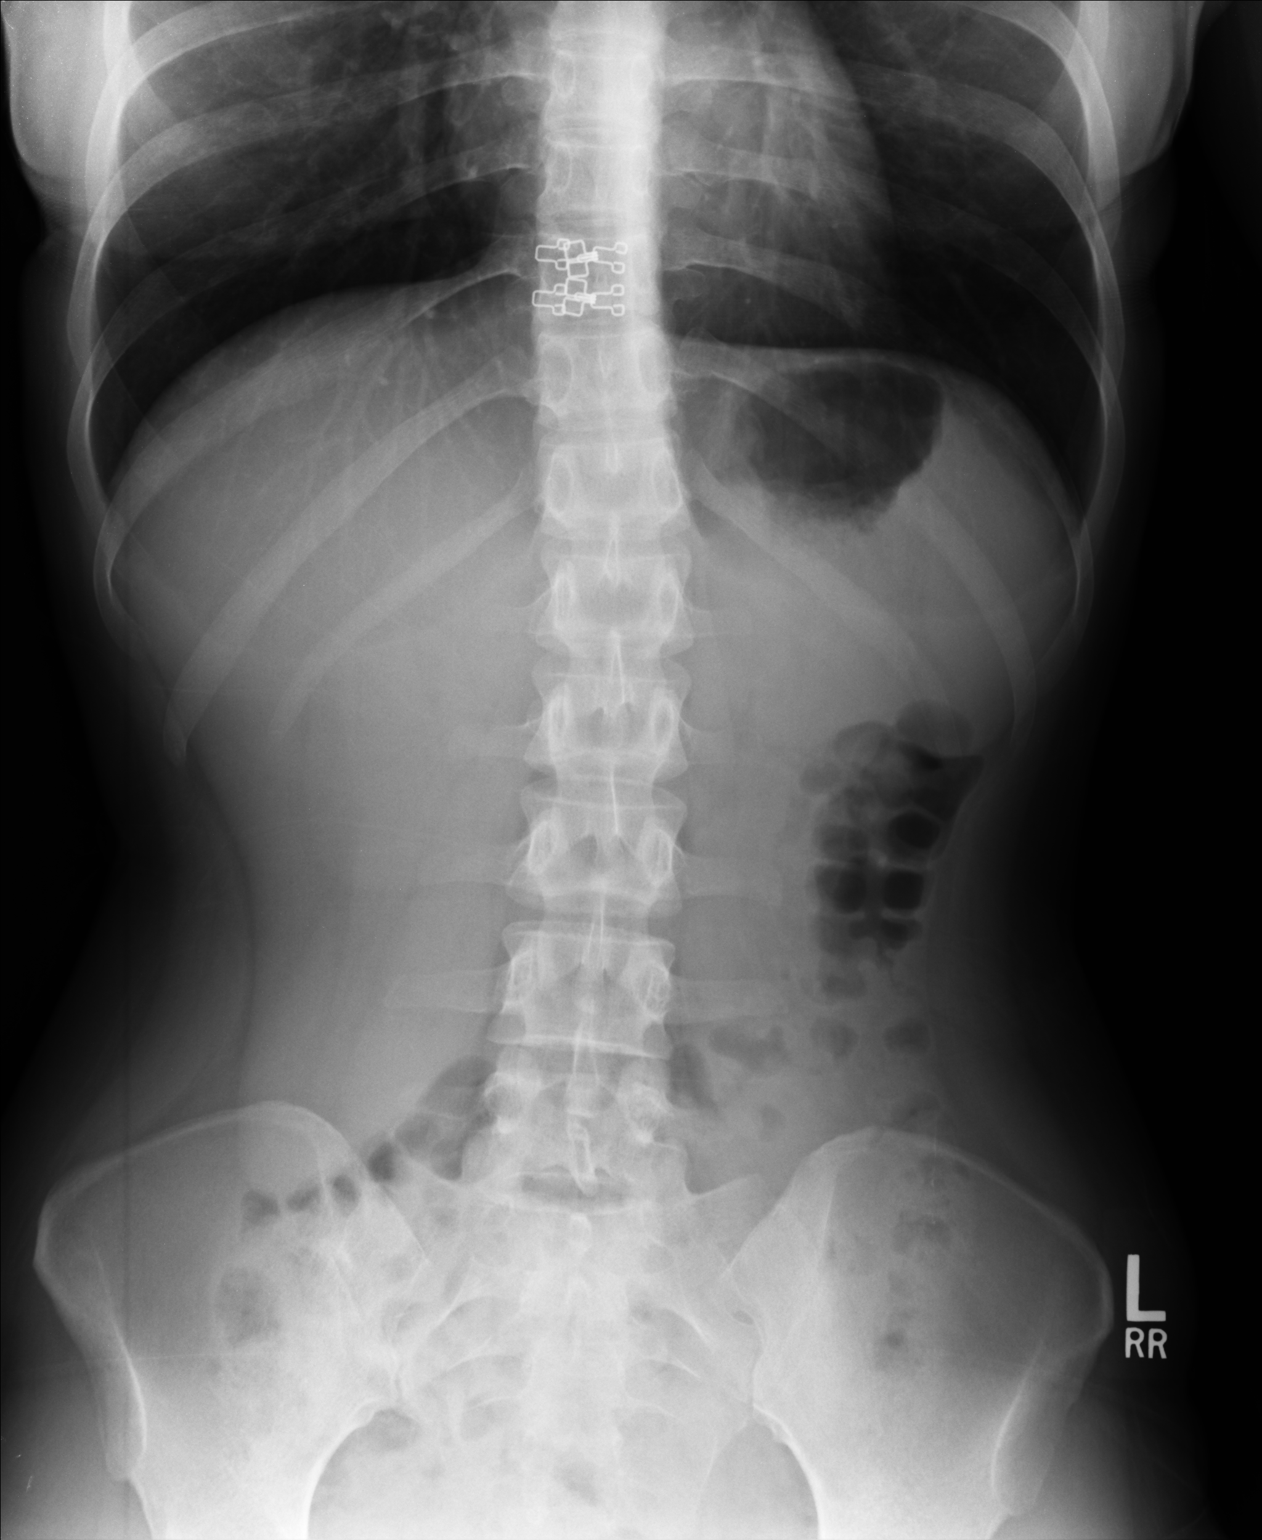

[AP (2 of 2)]
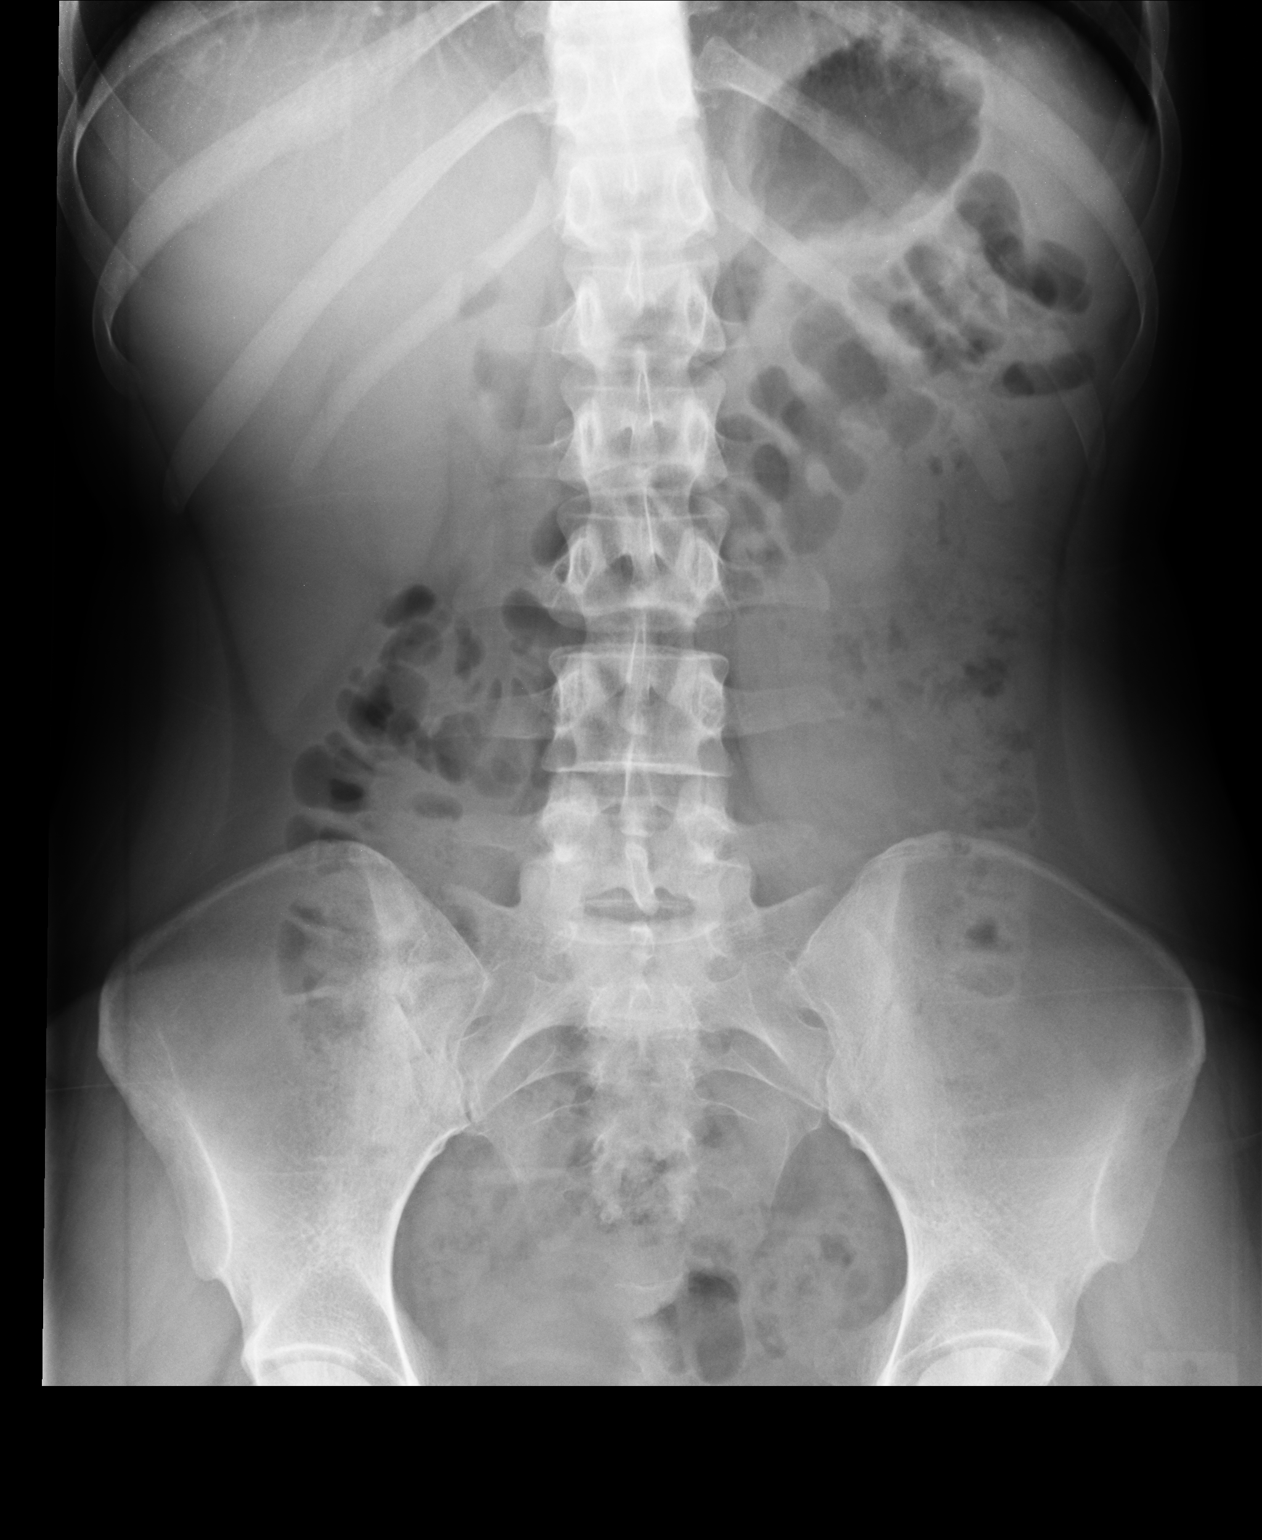

[2 of 2 positions shown; findings below may reference images not displayed]

FINDINGS: Supine and erect views of the abdomen show no bowel obstruction. No
free air is seen on the erect view. No evidence of constipation is
noted. No opaque calculi are noted. The bones are unremarkable.
IMPRESSION: No bowel obstruction. No radiographic evidence of constipation is
seen.

## 2017-01-12 ENCOUNTER — Ambulatory Visit (INDEPENDENT_AMBULATORY_CARE_PROVIDER_SITE_OTHER): Payer: PPO | Admitting: Physician Assistant

## 2017-01-12 VITALS — BP 114/70 | HR 82 | Temp 98.7°F | Resp 16 | Ht 61.5 in | Wt 112.1 lb

## 2017-01-12 DIAGNOSIS — Z23 Encounter for immunization: Secondary | ICD-10-CM | POA: Diagnosis not present

## 2017-01-12 DIAGNOSIS — R319 Hematuria, unspecified: Secondary | ICD-10-CM

## 2017-01-12 LAB — POCT URINALYSIS DIP (MANUAL ENTRY)
Bilirubin, UA: NEGATIVE
Blood, UA: NEGATIVE
GLUCOSE UA: NEGATIVE
Ketones, POC UA: NEGATIVE
Leukocytes, UA: NEGATIVE
NITRITE UA: NEGATIVE
Protein Ur, POC: NEGATIVE
Spec Grav, UA: 1.015
UROBILINOGEN UA: 0.2
pH, UA: 8.5

## 2017-01-12 LAB — POC MICROSCOPIC URINALYSIS (UMFC): Mucus: ABSENT

## 2017-01-12 NOTE — Progress Notes (Signed)
Patient ID: Adrienne Hawkins, female    DOB: [birth year], 21 y.o.   MRN: [id number]  PCP: No PCP Per Patient  Chief Complaint  Patient presents with  . Hematuria    Noticed 3 days ago-none seen since then  . Flu Vaccine    Subjective:   Presents for evaluation of blood in the urine 3 days ago.  She had one day of seeing what she thinks was blood in the urine, on the TP with wiping. None since.  RIGHT sided back pain Urinary burning is mild. No increased frequency or urgency. LMP 12/14/2016. Never sexually active.  Review of Systems As above. No fever, chills. No nausea/vomiting or diarrhea. No unexplained myalgias or arthralgias.    Patient Active Problem List   Diagnosis Date Noted  . Rash and nonspecific skin eruption 07/24/2016  . Reaction, adjustment, with anxious, depressed mood 06/29/2014     Prior to Admission medications   Medication Sig Start Date End Date Taking? Authorizing Provider  FOLIC ACID PO Take by mouth.   Yes Historical Provider, MD     No Known Allergies     Objective:  Physical Exam  Constitutional: She is oriented to person, place, and time. She appears well-developed and well-nourished. She is active and cooperative. No distress.  BP 114/70 (BP Location: Right Arm, Patient Position: Sitting, Cuff Size: Normal)   Pulse 82   Temp 98.7 F (37.1 C) (Oral)   Resp 16   Ht 5' 1.5" (1.562 m)   Wt 112 lb 2 oz (50.9 kg)   LMP 12/12/2016 (Approximate)   SpO2 100%   BMI 20.84 kg/m   HENT:  Head: Normocephalic and atraumatic.  Right Ear: Hearing normal.  Left Ear: Hearing normal.  Eyes: Conjunctivae are normal. No scleral icterus.  Neck: Normal range of motion. Neck supple. No thyromegaly present.  Cardiovascular: Normal rate, regular rhythm and normal heart sounds.   Pulses:      Radial pulses are 2+ on the right side, and 2+ on the left side.  Pulmonary/Chest: Effort normal and breath sounds normal.  Abdominal: Soft. She exhibits no  distension and no mass. There is tenderness in the epigastric area. There is no rebound and no guarding.  Lymphadenopathy:       Head (right side): No tonsillar, no preauricular, no posterior auricular and no occipital adenopathy present.       Head (left side): No tonsillar, no preauricular, no posterior auricular and no occipital adenopathy present.    She has no cervical adenopathy.       Right: No supraclavicular adenopathy present.       Left: No supraclavicular adenopathy present.  Neurological: She is alert and oriented to person, place, and time. No sensory deficit.  Skin: Skin is warm, dry and intact. No rash noted. No cyanosis or erythema. Nails show no clubbing.  Psychiatric: She has a normal mood and affect. Her speech is normal and behavior is normal.       Results for orders placed or performed in visit on 01/12/17  POCT Microscopic Urinalysis ([facility])  Result Value Ref Range   WBC,UR,HPF,POC None None WBC/hpf   RBC,UR,HPF,POC None None RBC/hpf   Bacteria Few (A) None, Too numerous to count   Mucus Absent Absent   Epithelial Cells, UR Per Microscopy Few (A) None, Too numerous to count cells/hpf  POCT urinalysis dipstick  Result Value Ref Range   Color, UA yellow yellow   Clarity, UA clear clear  Glucose, UA negative negative   Bilirubin, UA negative negative   Ketones, POC UA negative negative   Spec Grav, UA 1.015    Blood, UA negative negative   pH, UA 8.5    Protein Ur, POC negative negative   Urobilinogen, UA 0.2    Nitrite, UA Negative Negative   Leukocytes, UA Negative Negative       Assessment & Plan:   1. Hematuria, unspecified type No hematuria noted today. Hydrate well. If develops new symptoms, if the hematuria recurs or the burning persists, RTC for re-evaluation. - POCT Microscopic Urinalysis ([facility]) - POCT urinalysis dipstick  2. Need for influenza vaccination - Flu Vaccine QUAD 36+ mos IM - Care order/instruction:   Fernande Brashelle S. Paradise Vensel,  PA-C Physician Assistant-Certified Primary Care at [facility]

## 2017-01-12 NOTE — Patient Instructions (Addendum)
Drink at least 64 ounces of water daily.    IF you received an x-ray today, you will receive an invoice from [facility]. Please contact [facility] at [phone] with questions or concerns regarding your invoice.   IF you received labwork today, you will receive an invoice from [place]. Please contact [facility] at [phone] with questions or concerns regarding your invoice.   Our billing staff will not be able to assist you with questions regarding bills from these companies.  You will be contacted with the lab results as soon as they are available. The fastest way to get your results is to activate your My Chart account. Instructions are located on the last page of this paperwork. If you have not heard from [place] regarding the results in 2 weeks, please contact this office.     Influenza Virus Vaccine injection (Fluarix) What is this medicine? INFLUENZA VIRUS VACCINE (in floo EN zuh VAHY ruhs vak SEEN) helps to reduce the risk of getting influenza also known as the flu. This medicine may be used for other purposes; ask your health care provider or pharmacist if you have questions. COMMON BRAND NAME(S): Fluarix, Fluzone What should I tell my health care provider before I take this medicine? They need to know if you have any of these conditions: -bleeding disorder like hemophilia -fever or infection -Guillain-Barre syndrome or other neurological problems -immune system problems -infection with the human immunodeficiency virus (HIV) or AIDS -low blood platelet counts -multiple sclerosis -an unusual or allergic reaction to influenza virus vaccine, eggs, chicken proteins, latex, gentamicin, other medicines, foods, dyes or preservatives -pregnant or trying to get pregnant -breast-feeding How should I use this medicine? This vaccine is for injection into a muscle. It is given by a health care professional. A copy of Vaccine Information Statements will be given before  each vaccination. Read this sheet carefully each time. The sheet may change frequently. Talk to your pediatrician regarding the use of this medicine in children. Special care may be needed. Overdosage: If you think you have taken too much of this medicine contact a poison control center or emergency room at once. NOTE: This medicine is only for you. Do not share this medicine with others. What if I miss a dose? This does not apply. What may interact with this medicine? -chemotherapy or radiation therapy -medicines that lower your immune system like etanercept, anakinra, infliximab, and adalimumab -medicines that treat or prevent blood clots like warfarin -phenytoin -steroid medicines like prednisone or cortisone -theophylline -vaccines This list may not describe all possible interactions. Give your health care provider a list of all the medicines, herbs, non-prescription drugs, or dietary supplements you use. Also tell them if you smoke, drink alcohol, or use illegal drugs. Some items may interact with your medicine. What should I watch for while using this medicine? Report any side effects that do not go away within 3 days to your doctor or health care professional. Call your health care provider if any unusual symptoms occur within 6 weeks of receiving this vaccine. You may still catch the flu, but the illness is not usually as bad. You cannot get the flu from the vaccine. The vaccine will not protect against colds or other illnesses that may cause fever. The vaccine is needed every year. What side effects may I notice from receiving this medicine? Side effects that you should report to your doctor or health care professional as soon as possible: -allergic reactions like skin rash, itching or hives, swelling  of the face, lips, or tongue Side effects that usually do not require medical attention (report to your doctor or health care professional if they continue or are  bothersome): -fever -headache -muscle aches and pains -pain, tenderness, redness, or swelling at site where injected -weak or tired This list may not describe all possible side effects. Call your doctor for medical advice about side effects. You may report side effects to FDA at 1-800-FDA-1088. Where should I keep my medicine? This vaccine is only given in a clinic, pharmacy, doctor's office, or other health care setting and will not be stored at home. NOTE: This sheet is a summary. It may not cover all possible information. If you have questions about this medicine, talk to your doctor, pharmacist, or health care provider.  2017 Elsevier/Gold Standard (2008-06-30 09:30:40)

## 2017-01-29 ENCOUNTER — Ambulatory Visit (INDEPENDENT_AMBULATORY_CARE_PROVIDER_SITE_OTHER): Payer: PPO | Admitting: Physician Assistant

## 2017-01-29 VITALS — BP 110/78 | HR 91 | Temp 98.3°F | Resp 18 | Ht 61.5 in | Wt 115.0 lb

## 2017-01-29 DIAGNOSIS — J029 Acute pharyngitis, unspecified: Secondary | ICD-10-CM | POA: Diagnosis not present

## 2017-01-29 DIAGNOSIS — J039 Acute tonsillitis, unspecified: Secondary | ICD-10-CM

## 2017-01-29 DIAGNOSIS — R0981 Nasal congestion: Secondary | ICD-10-CM | POA: Diagnosis not present

## 2017-01-29 LAB — POCT RAPID STREP A (OFFICE): Rapid Strep A Screen: NEGATIVE

## 2017-01-29 MED ORDER — FLUTICASONE PROPIONATE 50 MCG/ACT NA SUSP: 2.0000 | Freq: Every day | NASAL | 0 refills | Status: DC

## 2017-01-29 MED ORDER — MAGIC MOUTHWASH W/LIDOCAINE: 10.0000 mL | ORAL | 0 refills | Status: DC | PRN

## 2017-01-29 NOTE — Progress Notes (Signed)
MRN: [id number] DOB: 1996/12/02  Subjective:   Adrienne Hawkins is a [age] y.o. female presenting for chief complaint of Sore Throat (recurrent) .  Reports 1 week history of subjective fever, sore throat and extreme fatigue,. She has also been having post nasal drip leading to really dry and sore throat in the mornings after sleeping.  Has tried OTC pain killer for relief. Denies sinus congestion, rhinorrhea, ear pain, myalgia and cough, night sweats, chills, nausea, vomiting, abdominal pain and diarrhea. Has not had sick contact with anyone. Has no history of seasonal allergies or asthma. Patient has had flu shot this season. No smoking or alcohol use.   Of note, pt states she has had issues with her tonsils since she was 77five years old. Notes they get inflammed at least twice a month. She was told when she was a child that she needed to have them removed but she did not have that done. Would like a referral to ENT for further evalatuion.   Adrienne Hawkins has a current medication list which includes the following prescription(s): folic acid. Also has No Known Allergies.  Adrienne Hawkins  has a past medical history of Anemia and Sickle cell anemia (HCC). Also  has no past surgical history on file.   Objective:   Vitals: BP 110/78 (BP Location: Right Arm, Patient Position: Sitting, Cuff Size: Small)   Pulse 91   Temp 98.3 F (36.8 C) (Oral)   Resp 18   Ht 5' 1.5" (1.562 m)   Wt 115 lb (52.2 kg)   LMP 01/22/2017   SpO2 100%   BMI 21.38 kg/m   Physical Exam  Constitutional: She is oriented to person, place, and time. She appears well-developed and well-nourished.  HENT:  Head: Normocephalic and atraumatic.  Right Ear: Tympanic membrane, external ear and ear canal normal.  Left Ear: Tympanic membrane, external ear and ear canal normal.  Nose: Mucosal edema (prominent, bilateral) present. Right sinus exhibits no maxillary sinus tenderness and no frontal sinus tenderness. Left sinus exhibits no maxillary  sinus tenderness and no frontal sinus tenderness.  Mouth/Throat: Uvula is midline and mucous membranes are normal. Posterior oropharyngeal erythema present. Tonsils are 2+ on the right. Tonsils are 2+ on the left. No tonsillar exudate.  Eyes: Conjunctivae are normal.  Neck: Normal range of motion.  Pulmonary/Chest: Effort normal.  Lymphadenopathy:       Head (right side): Submandibular adenopathy present. No submental, no tonsillar, no preauricular, no posterior auricular and no occipital adenopathy present.       Head (left side): Submandibular adenopathy present. No submental, no tonsillar, no preauricular, no posterior auricular and no occipital adenopathy present.    She has cervical adenopathy.       Right cervical: Superficial cervical adenopathy present. No deep cervical and no posterior cervical adenopathy present.      Left cervical: Superficial cervical adenopathy present. No deep cervical and no posterior cervical adenopathy present.       Right: No supraclavicular adenopathy present.       Left: No supraclavicular adenopathy present.  Neurological: She is alert and oriented to person, place, and time.  Skin: Skin is warm and dry.  Psychiatric: She has a normal mood and affect.  Vitals reviewed.   Results for orders placed or performed in visit on 01/29/17 (from the past 24 hour(s))  POCT rapid strep A     Status: None   Collection Time: 01/29/17 12:32 PM  Result Value Ref Range   Rapid Strep A  Screen Negative Negative    Assessment and Plan :  1. Sore throat Labs pending, will treat symptomatically while waiting for results. Pt instructed to return to clinic if symptoms worsen, do not improve, or as needed - Epstein-Barr virus VCA antibody panel - POCT rapid strep A - Culture, Group A Strep - Ambulatory referral to ENT - magic mouthwash w/lidocaine SOLN; Take 10 mLs by mouth every 2 (two) hours as needed for mouth pain.  Dispense: 360 mL; Refill: 0  2. Acute  tonsillitis, unspecified etiology Due to frequency of tonsillitis, pt would likely benefit from referral to ENT for further evaluation.  - Ambulatory referral to ENT  3. Nasal congestion -Pt does have prominent mucosal edema which could be contributing to PND. Recommend nasal steroid and cool mist humidifier at night.  - fluticasone (FLONASE) 50 MCG/ACT nasal spray; Place 2 sprays into both nostrils daily.  Dispense: 16 g; Refill: 0  Benjiman Core, PA-C  [facility] 01/29/2017 12:38 PM

## 2017-01-29 NOTE — Patient Instructions (Addendum)
Use flonase daily for nasal congestion as this may also help with post nasal drip in the morning. I recommend getting a cool mist humidifier and running it at night time.   You can also use the mouthwash for sore throat.   ENT should contact you in a couple of weeks for an appointment.   We will contact you with your lab results in a few days. Thank you for letting me participate in your health and well being.    Tonsillitis Tonsillitis is an infection of the throat. This infection causes the tonsils to become red, tender, and puffy (swollen). Tonsils are groups of tissue at the back of your throat. If bacteria caused your infection, antibiotic medicine will be given to you. Sometimes symptoms of tonsillitis can be relieved with the use of steroid medicine. If your tonsillitis is severe and happens often, you may need to get your tonsils removed (tonsillectomy). Follow these instructions at home:  Rest and sleep often.  Drink enough fluids to keep your pee (urine) clear or pale yellow.  While your throat is sore, eat soft or liquid foods like:  Soup.  Ice cream.  Instant breakfast drinks.  Eat frozen ice pops.  Gargle with a warm or cold liquid to help soothe the throat. Gargle with a water and salt mix. Mix 1/4 teaspoon of salt and 1/4 teaspoon of baking soda in 1 cup of water.  Only take medicines as told by your doctor.  If you are given medicines (antibiotics), take them as told. Finish them even if you start to feel better. Contact a doctor if:  You have large, tender lumps in your neck.  You have a rash.  You cough up green, yellow-brown, or bloody fluid.  You cannot swallow liquids or food for 24 hours.  You notice that only one of your tonsils is swollen. Get help right away if:  You throw up (vomit).  You have a very bad headache.  You have a stiff neck.  You have chest pain.  You have trouble breathing or swallowing.  You have bad throat pain,  drooling, or your voice changes.  You have bad pain not helped by medicine.  You cannot fully open your mouth.  You have redness, puffiness, or bad pain in the neck.  You have a fever. This information is not intended to replace advice given to you by your health care provider. Make sure you discuss any questions you have with your health care provider. Document Released: 05/21/2008 Document Revised: 05/10/2016 Document Reviewed: 05/22/2013 Elsevier Interactive Patient Education  2017 [employer]   IF you received an x-ray today, you will receive an invoice from [facility]. Please contact [facility] at [phone] with questions or concerns regarding your invoice.   IF you received labwork today, you will receive an invoice from [facility]. Please contact [facility] at [phone] with questions or concerns regarding your invoice.   Our billing staff will not be able to assist you with questions regarding bills from these companies.  You will be contacted with the lab results as soon as they are available. The fastest way to get your results is to activate your My Chart account. Instructions are located on the last page of this paperwork. If you have not heard from us regarding the results in 2 weeks, please contact this office.

## 2017-01-31 LAB — EPSTEIN-BARR VIRUS VCA ANTIBODY PANEL
EBV Early Antigen Ab, IgG: <9 U/mL (ref 0.0–8.9)
EBV NA IgG: 411 U/mL — ABNORMAL HIGH (ref 0.0–17.9)
EBV VCA IgG: 125 U/mL — ABNORMAL HIGH (ref 0.0–17.9)
EBV VCA IgM: <36 U/mL (ref 0.0–35.9)

## 2017-02-02 LAB — CULTURE, GROUP A STREP

## 2017-02-05 ENCOUNTER — Other Ambulatory Visit: Payer: Self-pay | Admitting: Physician Assistant

## 2017-02-05 MED ORDER — AMOXICILLIN 500 MG PO CAPS: 500.0000 mg | ORAL_CAPSULE | Freq: Two times a day (BID) | ORAL | 0 refills | Status: AC

## 2017-02-05 NOTE — Progress Notes (Signed)
Pt contacted and informed of positive strep culture results. Sent in Rx for amoxicillin. 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   Meds ordered this encounter  Medications  . amoxicillin (AMOXIL) 500 MG capsule    Sig: Take 1 capsule (500 mg total) by mouth 2 (two) times daily.    Dispense:  20 capsule    Refill:  0    Order Specific Question:   Supervising Provider    Answer:   Ethelda ChickSMITH, KRISTI M [[id number]]

## 2017-02-25 ENCOUNTER — Other Ambulatory Visit: Payer: Self-pay | Admitting: Physician Assistant

## 2017-02-25 DIAGNOSIS — R0981 Nasal congestion: Secondary | ICD-10-CM

## 2017-03-18 ENCOUNTER — Encounter (HOSPITAL_COMMUNITY): Payer: Self-pay | Admitting: Emergency Medicine

## 2017-03-18 ENCOUNTER — Ambulatory Visit (HOSPITAL_COMMUNITY)
Admission: EM | Admit: 2017-03-18 | Discharge: 2017-03-18 | Disposition: A | Discharge status: Home / Self Care | Payer: PPO | Attending: Family Medicine | Admitting: Family Medicine

## 2017-03-18 DIAGNOSIS — S39012A Strain of muscle, fascia and tendon of lower back, initial encounter: Secondary | ICD-10-CM

## 2017-03-18 DIAGNOSIS — B349 Viral infection, unspecified: Secondary | ICD-10-CM

## 2017-03-18 MED ORDER — METHOCARBAMOL 500 MG PO TABS: 500.0000 mg | ORAL_TABLET | Freq: Two times a day (BID) | ORAL | 0 refills | Status: DC

## 2017-03-18 MED ORDER — ONDANSETRON 4 MG PO TBDP: 4.0000 mg | ORAL_TABLET | Freq: Three times a day (TID) | ORAL | 0 refills | Status: DC | PRN

## 2017-03-18 MED ORDER — DICLOFENAC SODIUM 75 MG PO TBEC: 75.0000 mg | DELAYED_RELEASE_TABLET | Freq: Two times a day (BID) | ORAL | 0 refills | Status: DC

## 2017-03-18 NOTE — ED Triage Notes (Signed)
The patient presented to the [facility] with a complaint of mid to upper back pain that started 3 days ago after lifting a heavy object.

## 2017-03-18 NOTE — ED Provider Notes (Signed)
CSN: [id number]     Arrival date & time 03/18/17  1910 History   First MD Initiated Contact with Patient 03/18/17 1943     Chief Complaint  Patient presents with  . Back Pain   (Consider location/radiation/quality/duration/timing/severity/associated sxs/prior Treatment) 21 year old female presents to clinic with a chief complaint of back pain that is been ongoing for 3-4 days. Stating the pain occurred after she lifted a "heavy" object, and that it has continued to hurt since. She also states in the last day she is experiencing some nausea, some vomiting, fatigue, and lethargy. She denies any shortness of breath, wheezing, chest pain or palpitations, swelling in her hands feet or ankles, or other systemic complaints.   The history is provided by the patient.  Back Pain  Location:  Thoracic spine Quality:  Shooting and stabbing Radiates to:  Does not radiate Pain severity:  Moderate Pain is:  Same all the time Onset quality:  Sudden Duration:  4 days Timing:  Constant Progression:  Unchanged Chronicity:  New Context: lifting heavy objects   Relieved by:  Nothing Worsened by:  Bending, movement and twisting Ineffective treatments:  Lying down (paracetamol ) Associated symptoms: abdominal pain   Associated symptoms: no bladder incontinence, no bowel incontinence, no chest pain, no dysuria, no fever, no headaches, no numbness, no tingling and no weakness   Abdominal pain:    Location:  Epigastric   Quality: aching, cramping and heavy     Severity:  Mild   Onset quality:  Gradual   Duration:  1 day   Timing:  Intermittent   Progression:  Waxing and waning   Chronicity:  New   Past Medical History:  Diagnosis Date  . Anemia   . Sickle cell anemia (HCC)    doesnt really know   History reviewed. No pertinent surgical history. History reviewed. No pertinent family history. Social History  Substance Use Topics  . Smoking status: Never Smoker  . Smokeless tobacco: Never Used   . Alcohol use No   OB History    No data available     Review of Systems  Constitutional: Positive for chills and fatigue. Negative for fever.  HENT: Negative for congestion, rhinorrhea, sinus pain, sinus pressure and sore throat.   Eyes: Negative for pain and itching.  Respiratory: Negative for cough, shortness of breath and wheezing.   Cardiovascular: Negative for chest pain and palpitations.  Gastrointestinal: Positive for abdominal pain and nausea. Negative for bowel incontinence, diarrhea and vomiting.  Genitourinary: Negative for bladder incontinence, dysuria and frequency.  Musculoskeletal: Positive for back pain. Negative for neck pain and neck stiffness.  Skin: Negative for pallor and rash.  Neurological: Negative for dizziness, tingling, weakness, numbness and headaches.  All other systems reviewed and are negative.   Allergies  Patient has no known allergies.  Home Medications   Prior to Admission medications   Medication Sig Start Date End Date Taking? Authorizing Provider  diclofenac (VOLTAREN) 75 MG EC tablet Take 1 tablet (75 mg total) by mouth 2 (two) times daily. 03/18/17   Dorena Bodo, NP  methocarbamol (ROBAXIN) 500 MG tablet Take 1 tablet (500 mg total) by mouth 2 (two) times daily. 03/18/17   Dorena Bodo, NP  ondansetron (ZOFRAN ODT) 4 MG disintegrating tablet Take 1 tablet (4 mg total) by mouth every 8 (eight) hours as needed for nausea or vomiting. 03/18/17   Dorena Bodo, NP   Meds Ordered and Administered this Visit  Medications - No data to display  BP 126/78 (BP Location: Right Arm)   Pulse 83   Temp 98.6 F (37 C) (Oral)   Resp 18   SpO2 98%  No data found.   Physical Exam  Constitutional: She is oriented to person, place, and time. She appears well-developed and well-nourished. No distress.  HENT:  Head: Normocephalic and atraumatic.  Right Ear: External ear normal.  Left Ear: External ear normal.  Neck: Normal range of motion.   Cardiovascular: Normal rate and regular rhythm.   Pulmonary/Chest: Effort normal and breath sounds normal.  Abdominal: Soft. Bowel sounds are normal. She exhibits no distension. There is no tenderness.  Musculoskeletal: She exhibits no edema.       Thoracic back: She exhibits tenderness and spasm. She exhibits no deformity.  Neurological: She is alert and oriented to person, place, and time.  Skin: Skin is warm and dry. Capillary refill takes less than 2 seconds. She is not diaphoretic.  Psychiatric: She has a normal mood and affect. Her behavior is normal.  Nursing note and vitals reviewed.   Urgent Care Course     Procedures (including critical care time)  Labs Review Labs Reviewed - No data to display  Imaging Review No results found.    MDM   1. Strain of lumbar region, initial encounter   2. Viral illness    Given prescription for Robaxin, diclofenac for her back, given prescription for Zofran for nausea. Her vital counseling on over-the-counter therapies for viral illnesses, advised rest, drink plenty of fluids, should her symptoms persist follow up with her primary care provider, student health, or return to clinic. If at any time her symptoms worsen, go to the emergency room.    Dorena Bodo, NP 03/18/17 2026

## 2017-03-18 NOTE — Discharge Instructions (Signed)
For Nausea, I have prescribed Zofran, take 1 tablet under the tongue every 8 hours as needed.   You most likely have a strained muscle in your lower back. I have prescribed two medicines for your pain. The first is diclofenac, take 1 tablet twice a day and the other is Robaxin, take 1 tablet twice a day. Robaxin may cause drowsiness so do not drive until you know how this medicine affects you. Also do not drink any alcohol either. You may apply ice and alternate with heat for 15 minutes at a time 4 times daily and for additional pain control you may take tylenol over the counter ever 4 hours but do not take more than 4000 mg a day. Should your pain continue or fail to resolve, follow up with your primary care provider or return to clinic as needed.

## 2017-03-27 ENCOUNTER — Other Ambulatory Visit: Payer: Self-pay | Admitting: Physician Assistant

## 2017-03-27 DIAGNOSIS — R0981 Nasal congestion: Secondary | ICD-10-CM

## 2017-04-28 ENCOUNTER — Encounter (HOSPITAL_COMMUNITY): Payer: Self-pay | Admitting: Emergency Medicine

## 2017-04-28 ENCOUNTER — Ambulatory Visit (HOSPITAL_COMMUNITY)
Admission: EM | Admit: 2017-04-28 | Discharge: 2017-04-28 | Disposition: A | Discharge status: Home / Self Care | Payer: PPO | Attending: Internal Medicine | Admitting: Internal Medicine

## 2017-04-28 DIAGNOSIS — R05 Cough: Secondary | ICD-10-CM

## 2017-04-28 DIAGNOSIS — R059 Cough, unspecified: Secondary | ICD-10-CM

## 2017-04-28 DIAGNOSIS — J00 Acute nasopharyngitis [common cold]: Secondary | ICD-10-CM | POA: Diagnosis not present

## 2017-04-28 MED ORDER — IPRATROPIUM BROMIDE 0.06 % NA SOLN: 2.0000 | Freq: Four times a day (QID) | NASAL | 0 refills | Status: DC

## 2017-04-28 MED ORDER — BENZONATATE 100 MG PO CAPS: 200.0000 mg | ORAL_CAPSULE | Freq: Three times a day (TID) | ORAL | 0 refills | Status: DC | PRN

## 2017-04-28 NOTE — ED Provider Notes (Signed)
CSN: [id number]     Arrival date & time 04/28/17  1659 History   None    Chief Complaint  Patient presents with  . URI   (Consider location/radiation/quality/duration/timing/severity/associated sxs/prior Treatment) Patient c/o sinus congestion and uri sx's for 2 days.   The history is provided by the patient.  URI  Presenting symptoms: congestion, cough and fatigue   Severity:  Mild Onset quality:  Sudden Duration:  2 days Timing:  Constant Progression:  Waxing and waning Chronicity:  New Relieved by:  Nothing Worsened by:  Nothing Ineffective treatments:  None tried   Past Medical History:  Diagnosis Date  . Anemia   . Sickle cell anemia (HCC)    doesnt really know   History reviewed. No pertinent surgical history. No family history on file. Social History  Substance Use Topics  . Smoking status: Never Smoker  . Smokeless tobacco: Never Used  . Alcohol use No   OB History    No data available     Review of Systems  Constitutional: Positive for fatigue.  HENT: Positive for congestion.   Eyes: Negative.   Respiratory: Positive for cough.   Cardiovascular: Negative.   Gastrointestinal: Negative.   Endocrine: Negative.   Genitourinary: Negative.   Musculoskeletal: Negative.   Allergic/Immunologic: Negative.   Neurological: Negative.   Hematological: Negative.   Psychiatric/Behavioral: Negative.     Allergies  Patient has no known allergies.  Home Medications   Prior to Admission medications   Medication Sig Start Date End Date Taking? Authorizing Provider  benzonatate (TESSALON) 100 MG capsule Take 2 capsules (200 mg total) by mouth 3 (three) times daily as needed for cough. 04/28/17   Deatra Canterxford, Maurizio Geno J, FNP  diclofenac (VOLTAREN) 75 MG EC tablet Take 1 tablet (75 mg total) by mouth 2 (two) times daily. 03/18/17   Dorena BodoKennard, Lawrence, NP  fluticasone [facility](FLONASE) 50 MCG/ACT nasal spray SHAKE LIQUID AND USE 2 SPRAYS IN [facility] NOSTRIL DAILY 03/27/17   Barnett AbuWiseman,  GrenadaBrittany D, PA-C  ipratropium (ATROVENT) 0.06 % nasal spray Place 2 sprays into both nostrils 4 (four) times daily. 04/28/17   Deatra Canterxford, Daejah Klebba J, FNP  methocarbamol (ROBAXIN) 500 MG tablet Take 1 tablet (500 mg total) by mouth 2 (two) times daily. 03/18/17   Dorena BodoKennard, Lawrence, NP  ondansetron (ZOFRAN ODT) 4 MG disintegrating tablet Take 1 tablet (4 mg total) by mouth every 8 (eight) hours as needed for nausea or vomiting. 03/18/17   Dorena BodoKennard, Lawrence, NP   Meds Ordered and Administered this Visit  Medications - No data to display  BP 107/68 (BP Location: Right Arm)   Pulse 85   Temp 98.4 F (36.9 C) (Oral)   Resp 20   LMP 03/22/2017   SpO2 100%  No data found.   Physical Exam  Constitutional: She appears well-developed and well-nourished.  HENT:  Head: Normocephalic and atraumatic.  Right Ear: External ear normal.  Left Ear: External ear normal.  Mouth/Throat: Oropharynx is clear and moist.  Eyes: Conjunctivae and EOM are normal. Pupils are equal, round, and reactive to light.  Neck: Normal range of motion. Neck supple.  Cardiovascular: Normal rate, regular rhythm and normal heart sounds.   Pulmonary/Chest: Effort normal and breath sounds normal.  Abdominal: Soft. Bowel sounds are normal.  Musculoskeletal: Normal range of motion.  Nursing note and vitals reviewed.   Urgent Care Course     Procedures (including critical care time)  Labs Review Labs Reviewed - No data to display  Imaging Review No results  found.   Visual Acuity Review  Right Eye Distance:   Left Eye Distance:   Bilateral Distance:    Right Eye Near:   Left Eye Near:    Bilateral Near:         MDM   1. Acute nasopharyngitis   2. Cough    Tessalon Perles Atrovent Nasal spray  Push po fluids, rest, tylenol and motrin otc prn as directed for fever, arthralgias, and myalgias.  Follow up prn if sx's continue or persist.   Deatra Canter, FNP 04/28/17 1839

## 2017-04-28 NOTE — ED Triage Notes (Signed)
Stuffy nose, dry cough, cough makes chest hurt.  Symptoms started last Friday-04/26/2017

## 2017-12-15 ENCOUNTER — Encounter (HOSPITAL_COMMUNITY): Payer: Self-pay | Admitting: Emergency Medicine

## 2017-12-15 ENCOUNTER — Ambulatory Visit (HOSPITAL_COMMUNITY)
Admission: EM | Admit: 2017-12-15 | Discharge: 2017-12-15 | Disposition: A | Discharge status: Home / Self Care | Payer: PPO | Attending: Internal Medicine | Admitting: Internal Medicine

## 2017-12-15 ENCOUNTER — Other Ambulatory Visit: Payer: Self-pay

## 2017-12-15 DIAGNOSIS — D571 Sickle-cell disease without crisis: Secondary | ICD-10-CM | POA: Insufficient documentation

## 2017-12-15 DIAGNOSIS — Z79899 Other long term (current) drug therapy: Secondary | ICD-10-CM | POA: Diagnosis not present

## 2017-12-15 DIAGNOSIS — J00 Acute nasopharyngitis [common cold]: Secondary | ICD-10-CM | POA: Diagnosis not present

## 2017-12-15 DIAGNOSIS — D649 Anemia, unspecified: Secondary | ICD-10-CM | POA: Diagnosis not present

## 2017-12-15 DIAGNOSIS — J069 Acute upper respiratory infection, unspecified: Secondary | ICD-10-CM | POA: Diagnosis present

## 2017-12-15 LAB — POCT RAPID STREP A
STREPTOCOCCUS, GROUP A SCREEN (DIRECT): NEGATIVE
Streptococcus, Group A Screen (Direct): NEGATIVE

## 2017-12-15 MED ORDER — CETIRIZINE-PSEUDOEPHEDRINE ER 5-120 MG PO TB12: 1.0000 | ORAL_TABLET | Freq: Every day | ORAL | 0 refills | Status: DC

## 2017-12-15 MED ORDER — FLUTICASONE PROPIONATE 50 MCG/ACT NA SUSP: 2.0000 | Freq: Every day | NASAL | 0 refills | Status: DC

## 2017-12-15 MED ORDER — BENZONATATE 100 MG PO CAPS: 100.0000 mg | ORAL_CAPSULE | Freq: Three times a day (TID) | ORAL | 0 refills | Status: DC

## 2017-12-15 NOTE — ED Provider Notes (Signed)
[facility]    CSN: [id number] Arrival date & time: 12/15/17  1307     History   Chief Complaint Chief Complaint  Patient presents with  . URI    HPI Adrienne Hawkins is a 21 y.o. female.   21 year old female comes in for 2-3-day history of URI symptoms. She has been experiencing cough, sore throat, nasal congestion, headache, rhinorrhea, sinus drainage. Chest soreness with coughing, denies chest pain at rest. Denies shortness of breath, wheezing, weakness, dizziness. Denies fever, chills, night sweats. Positive sick contact.  Never smoker, passive smoker at home.       Past Medical History:  Diagnosis Date  . Anemia   . Sickle cell anemia (HCC)    doesnt really know    Patient Active Problem List   Diagnosis Date Noted  . Rash and nonspecific skin eruption 07/24/2016  . Reaction, adjustment, with anxious, depressed mood 06/29/2014    History reviewed. No pertinent surgical history.  OB History    No data available       Home Medications    Prior to Admission medications   Medication Sig Start Date End Date Taking? Authorizing Provider  ferrous sulfate 325 (65 FE) MG tablet Take 325 mg by mouth daily with breakfast.   Yes [provider]  folic acid (FOLVITE) 1 MG tablet Take 1 mg by mouth daily.   Yes [provider]  benzonatate (TESSALON) 100 MG capsule Take 1 capsule (100 mg total) by mouth every 8 (eight) hours. 12/15/17   Cathie HoopsYu, Amy V, PA-C  cetirizine-pseudoephedrine (ZYRTEC-D) 5-120 MG tablet Take 1 tablet by mouth daily. 12/15/17   Cathie HoopsYu, Amy V, PA-C  diclofenac (VOLTAREN) 75 MG EC tablet Take 1 tablet (75 mg total) by mouth 2 (two) times daily. 03/18/17   Dorena BodoKennard, Lawrence, NP  fluticasone (FLONASE) 50 MCG/ACT nasal spray Place 2 sprays into both nostrils daily. 12/15/17   Cathie HoopsYu, Amy V, PA-C  ipratropium (ATROVENT) 0.06 % nasal spray Place 2 sprays into both nostrils 4 (four) times daily. 04/28/17   Deatra Canterxford, William J, FNP    methocarbamol (ROBAXIN) 500 MG tablet Take 1 tablet (500 mg total) by mouth 2 (two) times daily. 03/18/17   Dorena BodoKennard, Lawrence, NP  ondansetron (ZOFRAN ODT) 4 MG disintegrating tablet Take 1 tablet (4 mg total) by mouth every 8 (eight) hours as needed for nausea or vomiting. 03/18/17   Dorena BodoKennard, Lawrence, NP    Family History Family History  Problem Relation Age of Onset  . Hypertension Mother   . Sickle cell anemia Father     Social History Social History   Tobacco Use  . Smoking status: Never Smoker  . Smokeless tobacco: Never Used  Substance Use Topics  . Alcohol use: No  . Drug use: Not on file     Allergies   Patient has no known allergies.   Review of Systems Review of Systems  Reason unable to perform ROS: See HPI as above.     Physical Exam Triage Vital Signs ED Triage Vitals  Enc Vitals Group     BP 12/15/17 1440 115/64     Pulse Rate 12/15/17 1440 78     Resp 12/15/17 1440 20     Temp 12/15/17 1440 98.4 F (36.9 C)     Temp Source 12/15/17 1440 Oral     SpO2 12/15/17 1440 100 %     Weight --      Height --      Head Circumference --  Peak Flow --      Pain Score 12/15/17 1437 7     Pain Loc --      Pain Edu? --      Excl. in GC? --    No data found.  Updated Vital Signs BP 115/64 (BP Location: Right Arm)   Pulse 78   Temp 98.4 F (36.9 C) (Oral)   Resp 20   LMP 11/20/2017   SpO2 100%   Physical Exam  Constitutional: She is oriented to person, place, and time. She appears well-developed and well-nourished. No distress.  HENT:  Head: Normocephalic and atraumatic.  Right Ear: Tympanic membrane, external ear and ear canal normal. Tympanic membrane is not erythematous and not bulging.  Left Ear: Tympanic membrane, external ear and ear canal normal. Tympanic membrane is not erythematous and not bulging.  Nose: Mucosal edema and rhinorrhea present. Right sinus exhibits maxillary sinus tenderness. Right sinus exhibits no frontal sinus  tenderness. Left sinus exhibits maxillary sinus tenderness. Left sinus exhibits no frontal sinus tenderness.  Mouth/Throat: Uvula is midline and mucous membranes are normal. Posterior oropharyngeal erythema present. Tonsils are 2+ on the right. Tonsils are 2+ on the left. No tonsillar exudate.  Eyes: Conjunctivae are normal. Pupils are equal, round, and reactive to light.  Neck: Normal range of motion. Neck supple.  Cardiovascular: Normal rate, regular rhythm and normal heart sounds. Exam reveals no gallop and no friction rub.  No murmur heard. Pulmonary/Chest: Effort normal and breath sounds normal. She has no decreased breath sounds. She has no wheezes. She has no rhonchi. She has no rales.  Lymphadenopathy:    She has no cervical adenopathy.  Neurological: She is alert and oriented to person, place, and time.  Skin: Skin is warm and dry.  Psychiatric: She has a normal mood and affect. Her behavior is normal. Judgment normal.     UC Treatments / Results  Labs (all labs ordered are listed, but only abnormal results are displayed) Labs Reviewed  CULTURE, GROUP A STREP [facility]  POCT RAPID STREP A  POCT RAPID STREP A    EKG  EKG Interpretation None       Radiology No results found.  Procedures Procedures (including critical care time)  Medications Ordered in UC Medications - No data to display   Initial Impression / Assessment and Plan / UC Course  I have reviewed the triage vital signs and the nursing notes.  Pertinent labs & imaging results that were available during my care of the patient were reviewed by me and considered in my medical decision making (see chart for details).    Rapid strep negative. Discussed with patient history and exam most consistent with viral URI. Symptomatic treatment as needed. Push fluids. Return precautions given.    Final Clinical Impressions(s) / UC Diagnoses   Final diagnoses:  Acute nasopharyngitis    ED Discharge Orders         Ordered    benzonatate (TESSALON) 100 MG capsule  Every 8 hours     12/15/17 1559    fluticasone (FLONASE) 50 MCG/ACT nasal spray  Daily     12/15/17 1559    cetirizine-pseudoephedrine (ZYRTEC-D) 5-120 MG tablet  Daily     12/15/17 1559        Belinda FisherYu, Amy V, PA-C 12/15/17 1637

## 2017-12-15 NOTE — ED Triage Notes (Signed)
Onset of symptoms 2-3 days ago.  Complains of cough, sore throat, nasal congestion, headache, runny nose, sinus drainage

## 2017-12-15 NOTE — Discharge Instructions (Signed)
Rapid strep negative.  Tessalon for cough. Start flonase, zyrtec-D for nasal congestion. You can use over the counter nasal saline rinse such as neti pot for nasal congestion. Keep hydrated, your urine should be clear to pale yellow in color. Tylenol/motrin for fever and pain. Monitor for any worsening of symptoms, chest pain, shortness of breath, wheezing, swelling of the throat, follow up for reevaluation.  If experiencing chest pain without coughing, please go to the emergency department for evaluation.  For sore throat try using a honey-based tea. Use 3 teaspoons of honey with juice squeezed from half lemon. Place shaved pieces of ginger into 1/2-1 cup of water and warm over stove top. Then mix the ingredients and repeat every 4 hours as needed.

## 2017-12-18 LAB — CULTURE, GROUP A STREP (THRC)

## 2018-05-10 ENCOUNTER — Encounter: Payer: Self-pay | Admitting: Physician Assistant

## 2018-05-10 ENCOUNTER — Ambulatory Visit (INDEPENDENT_AMBULATORY_CARE_PROVIDER_SITE_OTHER): Payer: PPO | Admitting: Physician Assistant

## 2018-05-10 ENCOUNTER — Other Ambulatory Visit: Payer: Self-pay

## 2018-05-10 VITALS — BP 113/77 | HR 83 | Temp 98.3°F | Ht 62.0 in | Wt 119.0 lb

## 2018-05-10 DIAGNOSIS — F4322 Adjustment disorder with anxiety: Secondary | ICD-10-CM | POA: Diagnosis not present

## 2018-05-10 DIAGNOSIS — D649 Anemia, unspecified: Secondary | ICD-10-CM

## 2018-05-10 DIAGNOSIS — R079 Chest pain, unspecified: Secondary | ICD-10-CM

## 2018-05-10 DIAGNOSIS — Z1329 Encounter for screening for other suspected endocrine disorder: Secondary | ICD-10-CM

## 2018-05-10 DIAGNOSIS — Z1389 Encounter for screening for other disorder: Secondary | ICD-10-CM

## 2018-05-10 DIAGNOSIS — Z0000 Encounter for general adult medical examination without abnormal findings: Secondary | ICD-10-CM

## 2018-05-10 DIAGNOSIS — Z13220 Encounter for screening for lipoid disorders: Secondary | ICD-10-CM

## 2018-05-10 DIAGNOSIS — Z13228 Encounter for screening for other metabolic disorders: Secondary | ICD-10-CM

## 2018-05-10 LAB — POCT URINALYSIS DIP (MANUAL ENTRY)
BILIRUBIN UA: NEGATIVE
BILIRUBIN UA: NEGATIVE mg/dL
Glucose, UA: NEGATIVE mg/dL
LEUKOCYTES UA: NEGATIVE
Nitrite, UA: NEGATIVE
PH UA: 5.5 (ref 5.0–8.0)
PROTEIN UA: NEGATIVE mg/dL
RBC UA: NEGATIVE
SPEC GRAV UA: 1.02 (ref 1.010–1.025)
Urobilinogen, UA: 0.2 E.U./dL

## 2018-05-10 MED ORDER — HYDROXYZINE HCL 25 MG PO TABS: 12.5000 mg | ORAL_TABLET | Freq: Three times a day (TID) | ORAL | 0 refills | Status: AC | PRN

## 2018-05-10 NOTE — Progress Notes (Signed)
Adrienne Hawkins  MRN: [id number] DOB: [birth year]  Subjective:  Pt is a 22 y.o. female who presents for annual physical exam. Pt is fasting today.  Diet: Eats salad, chicken, rice, potatoes. Drinks mostly water and some juice. Takes daily multivitamin.  Sleep: 7-8 hours a night.  BM: 3 x a day.   Menstrual cycles: Regular, occur monthly. Lasts 7 days. LMP 04/23/18. Has never been sexually active.   Last annual physical: Years ago Last dental exam: 04/25/18 Last vision exam:  Years ago Last pap smear: Never   Vaccinations      Tetanus: 2015      HPV: never   Chronic issues: 1) Anemia   Issues she would also like to discuss today: 1) Intermittent chest pain x 3 months. Not present today. Happens once/twice a week. Will occur at random times. Lasts a few seconds then goes away. Described as sharp and is in the middle of her chest. Does not radiate. Denies nausea, vomiting, heart palpations, SOB, DOE, lower leg swelling, and diaphoresis associated with the chest pain. Walks and runs daily, gets at least 10,000 steps. Can exercise without any chest pain. Does endorse anxiety and panic attacks for the past few months. Notes she will feel chest pain and short of breath when she has these. Had one last month and called ambulance. Went to ED and was evaluated. In terms of anxiety, worries about home, school, and family. She is from [place] and really misses her friends and family. 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 It did help. Has never tried any medication.  Denies hallucinations, euphoria, depressed mood, SI and HI. No PMH of asthma or GERD. Denies smoking.  FH of CAD in mother and grandfather.   Patient Active Problem List   Diagnosis Date Noted  . Rash and nonspecific skin eruption 07/24/2016  . Reaction, adjustment, with anxious, depressed mood 06/29/2014    Current Outpatient Medications on File Prior to Visit  Medication Sig Dispense Refill  . folic acid  (FOLVITE) 1 MG tablet Take 1 mg by mouth daily.    . [DISCONTINUED] ibuprofen (ADVIL,MOTRIN) 200 MG tablet Take 200 mg by mouth every 6 (six) hours as needed.     No current facility-administered medications on file prior to visit.     No Known Allergies  Social History   Socioeconomic History  . Marital status: Single    Spouse name: Not on file  . Number of children: Not on file  . Years of education: Not on file  . Highest education level: Not on file  Occupational History  . Not on file  Social Needs  . Financial resource strain: Not hard at all  . Food insecurity:    Worry: Never true    Inability: Never true  . Transportation needs:    Medical: No    Non-medical: No  Tobacco Use  . Smoking status: Never Smoker  . Smokeless tobacco: Never Used  Substance and Sexual Activity  . Alcohol use: No  . Drug use: Never  . Sexual activity: Never    Birth control/protection: None  Lifestyle  . Physical activity:    Days per week: 7 days    Minutes per session: 90 min  . Stress: Rather much  Relationships  . Social connections:    Talks on phone: More than three times a week    Gets together: Twice a week    Attends religious service: More than 4 times per  year    Active member of club or organization: Yes    Attends meetings of clubs or organizations: More than 4 times per year    Relationship status: Never married  Other Topics Concern  . Not on file  Social History Narrative   Pt is from [place]. She is a Ship broker at [employer], studying speech pathology and audiology. Brother lives in [place]. Has a good friend group.      History reviewed. No pertinent surgical history.  Family History  Problem Relation Age of Onset  . Hypertension Mother   . Hyperlipidemia Mother   . CAD Mother   . Sickle cell anemia Father   . Healthy Sister   . Healthy Brother   . Heart disease Maternal Grandmother   . Hyperlipidemia Maternal Grandmother   . Hyperlipidemia Maternal  Grandfather   . Heart disease Maternal Grandfather   . Heart disease Paternal Grandmother   . Hyperlipidemia Paternal Grandmother   . Hyperlipidemia Paternal Grandfather   . Heart disease Paternal Grandfather     Review of Systems  Constitutional: Negative for activity change, appetite change, chills, diaphoresis, fatigue, fever and unexpected weight change.  HENT: Positive for congestion. Negative for dental problem, drooling, ear discharge, ear pain, facial swelling, hearing loss, mouth sores, nosebleeds, postnasal drip, rhinorrhea, sinus pressure, sinus pain, sneezing, sore throat, tinnitus, trouble swallowing and voice change.   Eyes: Negative for photophobia, pain, discharge, redness, itching and visual disturbance.  Respiratory: Negative for apnea, cough, choking, chest tightness, shortness of breath, wheezing and stridor.   Cardiovascular: Positive for chest pain. Negative for palpitations and leg swelling.  Gastrointestinal: Negative for abdominal distention, abdominal pain, anal bleeding, blood in stool, constipation, diarrhea, nausea, rectal pain and vomiting.  Endocrine: Negative for cold intolerance, heat intolerance, polydipsia, polyphagia and polyuria.  Genitourinary: Negative for decreased urine volume, difficulty urinating, dyspareunia, dysuria, enuresis, flank pain, frequency, genital sores, hematuria, menstrual problem, pelvic pain, urgency, vaginal bleeding, vaginal discharge and vaginal pain.  Musculoskeletal: Negative for arthralgias, back pain, gait problem, joint swelling, myalgias, neck pain and neck stiffness.  Skin: Negative for color change, pallor, rash and wound.  Allergic/Immunologic: Positive for environmental allergies. Negative for food allergies and immunocompromised state.  Neurological: Negative for dizziness, tremors, seizures, syncope, facial asymmetry, speech difficulty, weakness, light-headedness, numbness and headaches.  Hematological: Negative for  adenopathy. Does not bruise/bleed easily.  Psychiatric/Behavioral: Negative for agitation, behavioral problems, confusion, decreased concentration, dysphoric mood, hallucinations, self-injury, sleep disturbance and suicidal ideas. The patient is nervous/anxious. The patient is not hyperactive.     Objective:  BP 113/77 (BP Location: Left Arm, Patient Position: Sitting, Cuff Size: Normal)   Pulse 83   Temp 98.3 F (36.8 C) (Oral)   Ht '5\' 2"'  (1.575 m)   Wt 119 lb (54 kg)   SpO2 99%   BMI 21.77 kg/m   Physical Exam  Constitutional: She is oriented to person, place, and time. She appears well-developed and well-nourished. No distress.  HENT:  Head: Normocephalic and atraumatic.  Right Ear: Hearing, tympanic membrane, external ear and ear canal normal.  Left Ear: Hearing, tympanic membrane, external ear and ear canal normal.  Nose: Nose normal.  Mouth/Throat: Uvula is midline, oropharynx is clear and moist and mucous membranes are normal. No oropharyngeal exudate.  Eyes: Pupils are equal, round, and reactive to light. Conjunctivae, EOM and lids are normal. No scleral icterus.  Neck: Trachea normal and normal range of motion. No thyroid mass and no thyromegaly present.  Cardiovascular:  Normal rate, regular rhythm, normal heart sounds and intact distal pulses.  Pulmonary/Chest: Effort normal and breath sounds normal. She has no wheezes. She has no rhonchi. She has no rales. She exhibits no tenderness and no bony tenderness.  Abdominal: Soft. Normal appearance and bowel sounds are normal. There is no tenderness.  Lymphadenopathy:       Head (right side): No tonsillar, no preauricular, no posterior auricular and no occipital adenopathy present.       Head (left side): No tonsillar, no preauricular, no posterior auricular and no occipital adenopathy present.    She has no cervical adenopathy.       Right: No supraclavicular adenopathy present.       Left: No supraclavicular adenopathy present.    Neurological: She is alert and oriented to person, place, and time. She has normal strength and normal reflexes.  Skin: Skin is warm and dry.    Visual Acuity Screening   Right eye Left eye Both eyes  Without correction: '20/15 20/15 20/15 '  With correction:      No results found for this or any previous visit (from the past 24 hour(s)). EKG shows sinus rhythm with rate of 75 bpm. Low voltage noted. No acute ST or T wave abnormalities.  Findings presented and discussed with Dr. Mitchel Honour.   GAD 7 : Generalized Anxiety Score 05/10/2018  Nervous, Anxious, on Edge 3  Control/stop worrying 3  Worry too much - different things 3  Trouble relaxing 3  Restless 3  Easily annoyed or irritable 3  Afraid - awful might happen 3  Total GAD 7 Score 21  Anxiety Difficulty Somewhat difficult    Depression screen [facility] 2/9 05/10/2018 01/29/2017 01/12/2017 10/24/2015 10/12/2015  Decreased Interest 2 0 0 0 0  Down, Depressed, Hopeless 1 0 0 0 0  PHQ - 2 Score 3 0 0 0 0  Altered sleeping 1 - - - -  Tired, decreased energy 1 - - - -  Change in appetite 1 - - - -  Feeling bad or failure about yourself  1 - - - -  Trouble concentrating 2 - - - -  Moving slowly or fidgety/restless 1 - - - -  Suicidal thoughts 0 - - - -  PHQ-9 Score 10 - - - -  Difficult doing work/chores Somewhat difficult - - - -     Assessment and Plan :  Discussed healthy lifestyle, diet, exercise, preventative care, vaccinations, and addressed patient's concerns. Plan for follow up prn. Otherwise, plan for specific conditions below. 1. Annual physical exam Await lab results. DECLINES pap smear and Gardasil.  2. Anemia, unspecified type - CBC with Differential/Platelet  3. Screening for metabolic disorder  - [id number]  4. Screening cholesterol level - Lipid panel  5. Screening for thyroid disorder - TSH  6. Screening for hematuria or proteinuria  - POCT urinalysis dipstick  7. Intermittent chest pain Hx of chest  pain is atypical. Do not suspect cardiac etiology. EKG normal. Suspect this is due to anxiety. Pt reassured. Will attempt trial of hydroxyzine for panic attacks and see if this helps. If no improvement in two weeks, encouraged to return to office for further evaluation.  - EKG 12-Lead  8. Adjustment disorder with anxiety Pt is likely experiencing adjustment disorder from being away from family and friends in [place]. She is actually going home in two weeks, which she is very excited about. Discussed tx options. She would like to hold off on any long  term medication but is interested in short term nonaddicting medication and continuing with counseling. Advised her to return to clinic if symptoms worsen, do not improve, or as needed.  - hydrOXYzine (ATARAX/VISTARIL) 25 MG tablet; Take 0.5-1 tablets (12.5-25 mg total) by mouth every 8 (eight) hours as needed for anxiety.  Dispense: 30 tablet; Refill: 0   Side effects, risks, benefits, and alternatives of the medications and treatment plan prescribed today were discussed, and patient expressed understanding of the instructions given. No barriers to understanding were identified. Red flags discussed in detail. Pt expressed understanding regarding what to do in case of emergency/urgent symptoms.  Tenna Delaine, PA-C  [facility] 05/11/2018 3:49 PM

## 2018-05-10 NOTE — Patient Instructions (Addendum)
For anxiety, please try hydroxyzine as needed. I also recommend doing therapy at school. If you decide you want long term treatment, please return to office for further discussion.   In terms of intermittent chest pain, your ekg was normal. Your labs are pending. It seems that this is related to anxiety. Please try medication as prescribed. If it does not help, please let me know as we may need to refer you to a cardiologist. If you start to have exertional chest pain, blurred vision, shortness of breath, severe headache, lower leg swelling, or nausea/vomiting please seek care immediately here or at the ED.   Thank you for letting me participate in your health and well being.     Health Maintenance, Female Adopting a healthy lifestyle and getting preventive care can go a long way to promote health and wellness. Talk with your health care provider about what schedule of regular examinations is right for you. This is a good chance for you to check in with your provider about disease prevention and staying healthy. In between checkups, there are plenty of things you can do on your own. Experts have done a lot of research about which lifestyle changes and preventive measures are most likely to keep you healthy. Ask your health care provider for more information. Weight and diet Eat a healthy diet  Be sure to include plenty of vegetables, fruits, low-fat dairy products, and lean protein.  Do not eat a lot of foods high in solid fats, added sugars, or salt.  Get regular exercise. This is one of the most important things you can do for your health. ? Most adults should exercise for at least 150 minutes each week. The exercise should increase your heart rate and make you sweat (moderate-intensity exercise). ? Most adults should also do strengthening exercises at least twice a week. This is in addition to the moderate-intensity exercise.  Maintain a healthy weight  Body mass index (BMI) is a  measurement that can be used to identify possible weight problems. It estimates body fat based on height and weight. Your health care provider can help determine your BMI and help you achieve or maintain a healthy weight.  For females 53 years of age and older: ? A BMI below 18.5 is considered underweight. ? A BMI of 18.5 to 24.9 is normal. ? A BMI of 25 to 29.9 is considered overweight. ? A BMI of 30 and above is considered obese.  Watch levels of cholesterol and blood lipids  You should start having your blood tested for lipids and cholesterol at 24 years of age, then have this test every 5 years.  You may need to have your cholesterol levels checked more often if: ? Your lipid or cholesterol levels are high. ? You are older than 24 years of age. ? You are at high risk for heart disease.  Cancer screening Lung Cancer  Lung cancer screening is recommended for adults 29-41 years old who are at high risk for lung cancer because of a history of smoking.  A yearly low-dose CT scan of the lungs is recommended for people who: ? Currently smoke. ? Have quit within the past 15 years. ? Have at least a 30-pack-year history of smoking. A pack year is smoking an average of one pack of cigarettes a day for 1 year.  Yearly screening should continue until it has been 15 years since you quit.  Yearly screening should stop if you develop a health problem that would  prevent you from having lung cancer treatment.  Breast Cancer  Practice breast self-awareness. This means understanding how your breasts normally appear and feel.  It also means doing regular breast self-exams. Let your health care provider know about any changes, no matter how small.  If you are in your 20s or 30s, you should have a clinical breast exam (CBE) by a health care provider every 1-3 years as part of a regular health exam.  If you are 99 or older, have a CBE every year. Also consider having a breast X-ray (mammogram)  every year.  If you have a family history of breast cancer, talk to your health care provider about genetic screening.  If you are at high risk for breast cancer, talk to your health care provider about having an MRI and a mammogram every year.  Breast cancer gene (BRCA) assessment is recommended for women who have family members with BRCA-related cancers. BRCA-related cancers include: ? Breast. ? Ovarian. ? Tubal. ? Peritoneal cancers.  Results of the assessment will determine the need for genetic counseling and BRCA1 and BRCA2 testing.  Cervical Cancer Your health care provider may recommend that you be screened regularly for cancer of the pelvic organs (ovaries, uterus, and vagina). This screening involves a pelvic examination, including checking for microscopic changes to the surface of your cervix (Pap test). You may be encouraged to have this screening done every 3 years, beginning at age 6.  For women ages 69-65, health care providers may recommend pelvic exams and Pap testing every 3 years, or they may recommend the Pap and pelvic exam, combined with testing for human papilloma virus (HPV), every 5 years. Some types of HPV increase your risk of cervical cancer. Testing for HPV may also be done on women of any age with unclear Pap test results.  Other health care providers may not recommend any screening for nonpregnant women who are considered low risk for pelvic cancer and who do not have symptoms. Ask your health care provider if a screening pelvic exam is right for you.  If you have had past treatment for cervical cancer or a condition that could lead to cancer, you need Pap tests and screening for cancer for at least 20 years after your treatment. If Pap tests have been discontinued, your risk factors (such as having a new sexual partner) need to be reassessed to determine if screening should resume. Some women have medical problems that increase the chance of getting cervical  cancer. In these cases, your health care provider may recommend more frequent screening and Pap tests.  Colorectal Cancer  This type of cancer can be detected and often prevented.  Routine colorectal cancer screening usually begins at 24 years of age and continues through 24 years of age.  Your health care provider may recommend screening at an earlier age if you have risk factors for colon cancer.  Your health care provider may also recommend using home test kits to check for hidden blood in the stool.  A small camera at the end of a tube can be used to examine your colon directly (sigmoidoscopy or colonoscopy). This is done to check for the earliest forms of colorectal cancer.  Routine screening usually begins at age 77.  Direct examination of the colon should be repeated every 5-10 years through 24 years of age. However, you may need to be screened more often if early forms of precancerous polyps or small growths are found.  Skin Cancer  Check your  skin from head to toe regularly.  Tell your health care provider about any new moles or changes in moles, especially if there is a change in a mole's shape or color.  Also tell your health care provider if you have a mole that is larger than the size of a pencil eraser.  Always use sunscreen. Apply sunscreen liberally and repeatedly throughout the day.  Protect yourself by wearing long sleeves, pants, a wide-brimmed hat, and sunglasses whenever you are outside.  Heart disease, diabetes, and high blood pressure  High blood pressure causes heart disease and increases the risk of stroke. High blood pressure is more likely to develop in: ? People who have blood pressure in the high end of the normal range (130-139/85-89 mm Hg). ? People who are overweight or obese. ? People who are African American.  If you are 77-32 years of age, have your blood pressure checked every 3-5 years. If you are 31 years of age or older, have your blood  pressure checked every year. You should have your blood pressure measured twice-once when you are at a hospital or clinic, and once when you are not at a hospital or clinic. Record the average of the two measurements. To check your blood pressure when you are not at a hospital or clinic, you can use: ? An automated blood pressure machine at a pharmacy. ? A home blood pressure monitor.  If you are between 16 years and 48 years old, ask your health care provider if you should take aspirin to prevent strokes.  Have regular diabetes screenings. This involves taking a blood sample to check your fasting blood sugar level. ? If you are at a normal weight and have a low risk for diabetes, have this test once every three years after 24 years of age. ? If you are overweight and have a high risk for diabetes, consider being tested at a younger age or more often. Preventing infection Hepatitis B  If you have a higher risk for hepatitis B, you should be screened for this virus. You are considered at high risk for hepatitis B if: ? You were born in a country where hepatitis B is common. Ask your health care provider which countries are considered high risk. ? Your parents were born in a high-risk country, and you have not been immunized against hepatitis B (hepatitis B vaccine). ? You have HIV or AIDS. ? You use needles to inject street drugs. ? You live with someone who has hepatitis B. ? You have had sex with someone who has hepatitis B. ? You get hemodialysis treatment. ? You take certain medicines for conditions, including cancer, organ transplantation, and autoimmune conditions.  Hepatitis C  Blood testing is recommended for: ? Everyone born from [birth year] through 1967. ? Anyone with known risk factors for hepatitis C.  Sexually transmitted infections (STIs)  You should be screened for sexually transmitted infections (STIs) including gonorrhea and chlamydia if: ? You are sexually active and are  younger than 24 years of age. ? You are older than 24 years of age and your health care provider tells you that you are at risk for this type of infection. ? Your sexual activity has changed since you were last screened and you are at an increased risk for chlamydia or gonorrhea. Ask your health care provider if you are at risk.  If you do not have HIV, but are at risk, it may be recommended that you take a prescription medicine daily  to prevent HIV infection. This is called pre-exposure prophylaxis (PrEP). You are considered at risk if: ? You are sexually active and do not regularly use condoms or know the HIV status of your partner(s). ? You take drugs by injection. ? You are sexually active with a partner who has HIV.  Talk with your health care provider about whether you are at high risk of being infected with HIV. If you choose to begin PrEP, you should first be tested for HIV. You should then be tested every 3 months for as long as you are taking PrEP. Pregnancy  If you are premenopausal and you may become pregnant, ask your health care provider about preconception counseling.  If you may become pregnant, take 400 to 800 micrograms (mcg) of folic acid every day.  If you want to prevent pregnancy, talk to your health care provider about birth control (contraception). Osteoporosis and menopause  Osteoporosis is a disease in which the bones lose minerals and strength with aging. This can result in serious bone fractures. Your risk for osteoporosis can be identified using a bone density scan.  If you are 38 years of age or older, or if you are at risk for osteoporosis and fractures, ask your health care provider if you should be screened.  Ask your health care provider whether you should take a calcium or vitamin D supplement to lower your risk for osteoporosis.  Menopause may have certain physical symptoms and risks.  Hormone replacement therapy may reduce some of these symptoms and  risks. Talk to your health care provider about whether hormone replacement therapy is right for you. Follow these instructions at home:  Schedule regular health, dental, and eye exams.  Stay current with your immunizations.  Do not use any tobacco products including cigarettes, chewing tobacco, or electronic cigarettes.  If you are pregnant, do not drink alcohol.  If you are breastfeeding, limit how much and how often you drink alcohol.  Limit alcohol intake to no more than 1 drink per day for nonpregnant women. One drink equals 12 ounces of beer, 5 ounces of wine, or 1 ounces of hard liquor.  Do not use street drugs.  Do not share needles.  Ask your health care provider for help if you need support or information about quitting drugs.  Tell your health care provider if you often feel depressed.  Tell your health care provider if you have ever been abused or do not feel safe at home. This information is not intended to replace advice given to you by your health care provider. Make sure you discuss any questions you have with your health care provider. Document Released: 06/18/2011 Document Revised: 05/10/2016 Document Reviewed: 09/06/2015 Elsevier Interactive Patient Education  2018 [employer] Human Papillomavirus Quadrivalent Vaccine suspension for injection What is this medicine? HUMAN PAPILLOMAVIRUS VACCINE (HYOO muhn pap uh LOH muh vahy ruhs vak SEEN) is a vaccine. It is used to prevent infections of four types of the human papillomavirus. In women, the vaccine may lower your risk of getting cervical, vaginal, vulvar, or anal cancer and genital warts. In men, the vaccine may lower your risk of getting genital warts and anal cancer. You cannot get these diseases from the vaccine. This vaccine does not treat these diseases. This medicine may be used for other purposes; ask your health care provider or pharmacist if you have questions. COMMON BRAND NAME(S): Gardasil What should  I tell my health care provider before I take this medicine? They need to  know if you have any of these conditions: -fever or infection -hemophilia -HIV infection or AIDS -immune system problems -low platelet count -an unusual reaction to Human Papillomavirus Vaccine, yeast, other medicines, foods, dyes, or preservatives -pregnant or trying to get pregnant -breast-feeding How should I use this medicine? This vaccine is for injection in a muscle on your upper arm or thigh. It is given by a health care professional. Dennis Bast will be observed for 15 minutes after each dose. Sometimes, fainting happens after the vaccine is given. You may be asked to sit or lie down during the 15 minutes. Three doses are given. The second dose is given 2 months after the first dose. The last dose is given 4 months after the second dose. A copy of a Vaccine Information Statement will be given before each vaccination. Read this sheet carefully each time. The sheet may change frequently. Talk to your pediatrician regarding the use of this medicine in children. While this drug may be prescribed for children as young as 13 years of age for selected conditions, precautions do apply. Overdosage: If you think you have taken too much of this medicine contact a poison control center or emergency room at once. NOTE: This medicine is only for you. Do not share this medicine with others. What if I miss a dose? All 3 doses of the vaccine should be given within 6 months. Remember to keep appointments for follow-up doses. Your health care provider will tell you when to return for the next vaccine. Ask your health care professional for advice if you are unable to keep an appointment or miss a scheduled dose. What may interact with this medicine? -other vaccines This list may not describe all possible interactions. Give your health care provider a list of all the medicines, herbs, non-prescription drugs, or dietary supplements you use. Also  tell them if you smoke, drink alcohol, or use illegal drugs. Some items may interact with your medicine. What should I watch for while using this medicine? This vaccine may not fully protect everyone. Continue to have regular pelvic exams and cervical or anal cancer screenings as directed by your doctor. The Human Papillomavirus is a sexually transmitted disease. It can be passed by any kind of sexual activity that involves genital contact. The vaccine works best when given before you have any contact with the virus. Many people who have the virus do not have any signs or symptoms. Tell your doctor or health care professional if you have any reaction or unusual symptom after getting the vaccine. What side effects may I notice from receiving this medicine? Side effects that you should report to your doctor or health care professional as soon as possible: -allergic reactions like skin rash, itching or hives, swelling of the face, lips, or tongue -breathing problems -feeling faint or lightheaded, falls Side effects that usually do not require medical attention (report to your doctor or health care professional if they continue or are bothersome): -cough -dizziness -fever -headache -nausea -redness, warmth, swelling, pain, or itching at site where injected This list may not describe all possible side effects. Call your doctor for medical advice about side effects. You may report side effects to FDA at 1-800-FDA-1088. Where should I keep my medicine? This drug is given in a hospital or clinic and will not be stored at home. NOTE: This sheet is a summary. It may not cover all possible information. If you have questions about this medicine, talk to your doctor, pharmacist, or health care  provider.  2018 Elsevier/Gold Standard (2014-01-25 13:14:33)   IF you received an x-ray today, you will receive an invoice from [facility]. Please contact [facility] at [phone] with  questions or concerns regarding your invoice.   IF you received labwork today, you will receive an invoice from [facility]. Please contact [facility] at [phone] with questions or concerns regarding your invoice.   Our billing staff will not be able to assist you with questions regarding bills from these companies.  You will be contacted with the lab results as soon as they are available. The fastest way to get your results is to activate your My Chart account. Instructions are located on the last page of this paperwork. If you have not heard from [place] regarding the results in 2 weeks, please contact this office.

## 2018-05-11 LAB — CBC WITH DIFFERENTIAL/PLATELET
BASOS: 0 %
Basophils Absolute: 0 10*3/uL (ref 0.0–0.2)
EOS (ABSOLUTE): 0.1 10*3/uL (ref 0.0–0.4)
Eos: 1 %
HEMATOCRIT: 33.6 % — AB (ref 34.0–46.6)
Hemoglobin: 10 g/dL — ABNORMAL LOW (ref 11.1–15.9)
Immature Grans (Abs): 0 10*3/uL (ref 0.0–0.1)
Immature Granulocytes: 0 %
LYMPHS ABS: 2.5 10*3/uL (ref 0.7–3.1)
Lymphs: 56 %
MCH: 18.8 pg — ABNORMAL LOW (ref 26.6–33.0)
MCHC: 29.8 g/dL — AB (ref 31.5–35.7)
MCV: 63 fL — AB (ref 79–97)
MONOS ABS: 0.4 10*3/uL (ref 0.1–0.9)
Monocytes: 8 %
NEUTROS PCT: 35 %
Neutrophils Absolute: 1.6 10*3/uL (ref 1.4–7.0)
PLATELETS: 486 10*3/uL — AB (ref 150–450)
RBC: 5.33 x10E6/uL — AB (ref 3.77–5.28)
RDW: 17.5 % — AB (ref 12.3–15.4)
WBC: 4.6 10*3/uL (ref 3.4–10.8)

## 2018-05-11 LAB — CMP14+EGFR
A/G RATIO: 1.7 (ref 1.2–2.2)
ALK PHOS: 47 IU/L (ref 39–117)
ALT: 47 IU/L — AB (ref 0–32)
AST: 28 IU/L (ref 0–40)
Albumin: 4.3 g/dL (ref 3.5–5.5)
BILIRUBIN TOTAL: 0.3 mg/dL (ref 0.0–1.2)
BUN/Creatinine Ratio: 14 (ref 9–23)
BUN: 8 mg/dL (ref 6–20)
CALCIUM: 9.3 mg/dL (ref 8.7–10.2)
CHLORIDE: 103 mmol/L (ref 96–106)
CO2: 23 mmol/L (ref 20–29)
Creatinine, Ser: 0.58 mg/dL (ref 0.57–1.00)
GFR calc Af Amer: 150 mL/min/{1.73_m2} (ref >59)
GFR, EST NON AFRICAN AMERICAN: 130 mL/min/{1.73_m2} (ref >59)
GLOBULIN, TOTAL: 2.6 g/dL (ref 1.5–4.5)
Glucose: 100 mg/dL — ABNORMAL HIGH (ref 65–99)
Potassium: 4.4 mmol/L (ref 3.5–5.2)
SODIUM: 137 mmol/L (ref 134–144)
Total Protein: 6.9 g/dL (ref 6.0–8.5)

## 2018-05-11 LAB — LIPID PANEL
CHOL/HDL RATIO: 2.7 ratio (ref 0.0–4.4)
CHOLESTEROL TOTAL: 139 mg/dL (ref 100–199)
HDL: 51 mg/dL (ref >39)
LDL Calculated: 81 mg/dL (ref 0–99)
TRIGLYCERIDES: 35 mg/dL (ref 0–149)
VLDL Cholesterol Cal: 7 mg/dL (ref 5–40)

## 2018-05-11 LAB — TSH: TSH: 3.11 u[IU]/mL (ref 0.450–4.500)

## 2018-05-14 ENCOUNTER — Encounter: Payer: Self-pay | Admitting: *Deleted

## 2018-05-24 ENCOUNTER — Encounter: Payer: PPO | Admitting: Physician Assistant
